# Patient Record
Sex: Male | Born: 1935 | Race: White | Hispanic: No | Marital: Single | Smoking: Former smoker
Health system: Southern US, Community
[De-identification: ages and names within clinical notes are randomized; demographics above are authoritative.]

## PROBLEM LIST (undated history)

## (undated) DIAGNOSIS — G609 Hereditary and idiopathic neuropathy, unspecified: Secondary | ICD-10-CM

## (undated) DIAGNOSIS — N318 Other neuromuscular dysfunction of bladder: Secondary | ICD-10-CM

## (undated) DIAGNOSIS — Z9089 Acquired absence of other organs: Secondary | ICD-10-CM

## (undated) DIAGNOSIS — Z87891 Personal history of nicotine dependence: Secondary | ICD-10-CM

## (undated) DIAGNOSIS — E291 Testicular hypofunction: Secondary | ICD-10-CM

## (undated) DIAGNOSIS — R7309 Other abnormal glucose: Secondary | ICD-10-CM

## (undated) DIAGNOSIS — Z87898 Personal history of other specified conditions: Secondary | ICD-10-CM

## (undated) DIAGNOSIS — I739 Peripheral vascular disease, unspecified: Secondary | ICD-10-CM

## (undated) DIAGNOSIS — K5010 Crohn's disease of large intestine without complications: Secondary | ICD-10-CM

## (undated) DIAGNOSIS — K5732 Diverticulitis of large intestine without perforation or abscess without bleeding: Secondary | ICD-10-CM

## (undated) DIAGNOSIS — F528 Other sexual dysfunction not due to a substance or known physiological condition: Secondary | ICD-10-CM

## (undated) DIAGNOSIS — E785 Hyperlipidemia, unspecified: Secondary | ICD-10-CM

## (undated) HISTORY — DX: Hereditary and idiopathic neuropathy, unspecified: G60.9

## (undated) HISTORY — DX: Other sexual dysfunction not due to a substance or known physiological condition: F52.8

## (undated) HISTORY — PX: OTHER SURGICAL HISTORY: SHX169

## (undated) HISTORY — DX: Hyperlipidemia, unspecified: E78.5

## (undated) HISTORY — DX: Other neuromuscular dysfunction of bladder: N31.8

## (undated) HISTORY — DX: Testicular hypofunction: E29.1

## (undated) HISTORY — DX: Crohn's disease of large intestine without complications: K50.10

## (undated) HISTORY — DX: Acquired absence of other organs: Z90.89

## (undated) HISTORY — PX: SHOULDER ARTHROSCOPY: SHX128

## (undated) HISTORY — PX: TONSILLECTOMY: SHX5217

## (undated) HISTORY — DX: Other abnormal glucose: R73.09

## (undated) HISTORY — DX: Personal history of other specified conditions: Z87.898

## (undated) HISTORY — DX: Diverticulitis of large intestine without perforation or abscess without bleeding: K57.32

## (undated) HISTORY — DX: Peripheral vascular disease, unspecified: I73.9

## (undated) HISTORY — PX: SEPTOPLASTY: SUR1290

## (undated) HISTORY — DX: Personal history of nicotine dependence: Z87.891

---

## 1985-05-05 ENCOUNTER — Encounter: Payer: Self-pay | Admitting: Gastroenterology

## 1985-08-18 ENCOUNTER — Encounter: Payer: Self-pay | Admitting: Gastroenterology

## 1997-10-17 ENCOUNTER — Encounter: Payer: Self-pay | Admitting: Internal Medicine

## 1997-10-17 ENCOUNTER — Ambulatory Visit (HOSPITAL_COMMUNITY): Admission: RE | Admit: 1997-10-17 | Discharge: 1997-10-17 | Payer: Self-pay | Admitting: Internal Medicine

## 1999-07-18 ENCOUNTER — Encounter: Payer: Self-pay | Admitting: Physical Medicine and Rehabilitation

## 1999-07-18 ENCOUNTER — Encounter
Admission: RE | Admit: 1999-07-18 | Discharge: 1999-07-18 | Payer: Self-pay | Admitting: Physical Medicine and Rehabilitation

## 1999-11-19 ENCOUNTER — Encounter: Admission: RE | Admit: 1999-11-19 | Discharge: 1999-11-19 | Payer: Self-pay | Admitting: Internal Medicine

## 1999-11-19 ENCOUNTER — Encounter: Payer: Self-pay | Admitting: Internal Medicine

## 2002-03-03 ENCOUNTER — Ambulatory Visit (HOSPITAL_COMMUNITY): Admission: RE | Admit: 2002-03-03 | Discharge: 2002-03-03 | Payer: Self-pay | Admitting: Neurology

## 2003-06-13 ENCOUNTER — Encounter: Payer: Self-pay | Admitting: Gastroenterology

## 2003-11-23 ENCOUNTER — Ambulatory Visit: Payer: Self-pay | Admitting: Internal Medicine

## 2004-02-17 ENCOUNTER — Ambulatory Visit: Payer: Self-pay | Admitting: Internal Medicine

## 2004-03-20 ENCOUNTER — Ambulatory Visit: Payer: Self-pay | Admitting: Internal Medicine

## 2004-04-05 ENCOUNTER — Ambulatory Visit: Payer: Self-pay | Admitting: Internal Medicine

## 2004-04-13 ENCOUNTER — Ambulatory Visit: Payer: Self-pay

## 2004-10-18 ENCOUNTER — Ambulatory Visit: Payer: Self-pay | Admitting: Internal Medicine

## 2005-07-02 ENCOUNTER — Ambulatory Visit: Payer: Self-pay | Admitting: Internal Medicine

## 2005-08-29 ENCOUNTER — Ambulatory Visit: Payer: Self-pay | Admitting: Internal Medicine

## 2005-09-03 ENCOUNTER — Ambulatory Visit: Payer: Self-pay | Admitting: Internal Medicine

## 2005-09-11 ENCOUNTER — Ambulatory Visit: Payer: Self-pay | Admitting: Internal Medicine

## 2005-09-25 ENCOUNTER — Ambulatory Visit: Payer: Self-pay

## 2005-11-08 ENCOUNTER — Ambulatory Visit: Payer: Self-pay | Admitting: Internal Medicine

## 2005-12-09 ENCOUNTER — Ambulatory Visit: Payer: Self-pay | Admitting: Internal Medicine

## 2005-12-09 LAB — CONVERTED CEMR LAB
Basophils Absolute: 0.1 10*3/uL (ref 0.0–0.1)
Basophils Relative: 0.8 % (ref 0.0–1.0)
Eosinophil percent: 1.2 % (ref 0.0–5.0)
HCT: 51.3 % (ref 39.0–52.0)
Hemoglobin: 16.5 g/dL (ref 13.0–17.0)
Hgb A1c MFr Bld: 6.2 % — ABNORMAL HIGH (ref 4.6–6.0)
Lymphocytes Relative: 19.2 % (ref 12.0–46.0)
MCHC: 32.2 g/dL (ref 30.0–36.0)
MCV: 91.3 fL (ref 78.0–100.0)
Monocytes Absolute: 0.7 10*3/uL (ref 0.2–0.7)
Monocytes Relative: 8.6 % (ref 3.0–11.0)
Neutro Abs: 6 10*3/uL (ref 1.4–7.7)
Neutrophils Relative %: 70.2 % (ref 43.0–77.0)
Platelets: 299 10*3/uL (ref 150–400)
RBC: 5.62 M/uL (ref 4.22–5.81)
RDW: 16.2 % — ABNORMAL HIGH (ref 11.5–14.6)
WBC: 8.5 10*3/uL (ref 4.5–10.5)

## 2006-08-20 ENCOUNTER — Ambulatory Visit: Payer: Self-pay | Admitting: Gastroenterology

## 2006-09-03 ENCOUNTER — Ambulatory Visit: Payer: Self-pay | Admitting: Gastroenterology

## 2006-09-03 LAB — HM COLONOSCOPY

## 2006-09-24 ENCOUNTER — Ambulatory Visit: Payer: Self-pay | Admitting: Internal Medicine

## 2006-09-24 ENCOUNTER — Encounter: Payer: Self-pay | Admitting: Internal Medicine

## 2006-09-24 LAB — CONVERTED CEMR LAB
ALT: 54 units/L — ABNORMAL HIGH (ref 0–53)
AST: 35 units/L (ref 0–37)
Albumin: 3.8 g/dL (ref 3.5–5.2)
Alkaline Phosphatase: 46 units/L (ref 39–117)
BUN: 10 mg/dL (ref 6–23)
Basophils Absolute: 0.1 10*3/uL (ref 0.0–0.1)
Basophils Relative: 1.5 % — ABNORMAL HIGH (ref 0.0–1.0)
Bilirubin Urine: NEGATIVE
Bilirubin, Direct: 0.2 mg/dL (ref 0.0–0.3)
CO2: 31 meq/L (ref 19–32)
Calcium: 8.9 mg/dL (ref 8.4–10.5)
Chloride: 106 meq/L (ref 96–112)
Cholesterol: 196 mg/dL (ref 0–200)
Creatinine, Ser: 0.9 mg/dL (ref 0.4–1.5)
Eosinophils Absolute: 0.1 10*3/uL (ref 0.0–0.6)
Eosinophils Relative: 1.5 % (ref 0.0–5.0)
GFR calc Af Amer: 107 mL/min
GFR calc non Af Amer: 88 mL/min
Glucose, Bld: 127 mg/dL — ABNORMAL HIGH (ref 70–99)
HCT: 51.6 % (ref 39.0–52.0)
HDL: 27.4 mg/dL — ABNORMAL LOW (ref >39.0)
Hemoglobin: 17.7 g/dL — ABNORMAL HIGH (ref 13.0–17.0)
Ketones, ur: NEGATIVE mg/dL
LDL Cholesterol: 130 mg/dL — ABNORMAL HIGH (ref 0–99)
Leukocytes, UA: NEGATIVE
Lymphocytes Relative: 19.9 % (ref 12.0–46.0)
MCHC: 34.2 g/dL (ref 30.0–36.0)
MCV: 96 fL (ref 78.0–100.0)
Monocytes Absolute: 0.6 10*3/uL (ref 0.2–0.7)
Monocytes Relative: 8.6 % (ref 3.0–11.0)
Neutro Abs: 5 10*3/uL (ref 1.4–7.7)
Neutrophils Relative %: 68.5 % (ref 43.0–77.0)
Nitrite: NEGATIVE
PSA: 3.59 ng/mL (ref 0.10–4.00)
Platelets: 243 10*3/uL (ref 150–400)
Potassium: 4.8 meq/L (ref 3.5–5.1)
RBC: 5.38 M/uL (ref 4.22–5.81)
RDW: 13.3 % (ref 11.5–14.6)
Sodium: 142 meq/L (ref 135–145)
Specific Gravity, Urine: 1.015 (ref 1.000–1.03)
TSH: 3.02 microintl units/mL (ref 0.35–5.50)
Total Bilirubin: 1.1 mg/dL (ref 0.3–1.2)
Total CHOL/HDL Ratio: 7.2
Total Protein, Urine: NEGATIVE mg/dL
Total Protein: 6.9 g/dL (ref 6.0–8.3)
Triglycerides: 193 mg/dL — ABNORMAL HIGH (ref 0–149)
Urine Glucose: NEGATIVE mg/dL
Urobilinogen, UA: 0.2 (ref 0.0–1.0)
VLDL: 39 mg/dL (ref 0–40)
WBC: 7.2 10*3/uL (ref 4.5–10.5)
pH: 6 (ref 5.0–8.0)

## 2006-10-03 ENCOUNTER — Encounter: Payer: Self-pay | Admitting: Internal Medicine

## 2006-10-03 ENCOUNTER — Ambulatory Visit: Payer: Self-pay | Admitting: Internal Medicine

## 2006-10-08 ENCOUNTER — Ambulatory Visit: Payer: Self-pay | Admitting: Internal Medicine

## 2006-10-08 LAB — CONVERTED CEMR LAB
Glucose, 2 hour: 144 mg/dL — ABNORMAL HIGH (ref 70–139)
Hgb A1c MFr Bld: 6.8 % — ABNORMAL HIGH (ref 4.6–6.0)

## 2006-10-20 ENCOUNTER — Ambulatory Visit: Payer: Self-pay | Admitting: Internal Medicine

## 2007-02-17 ENCOUNTER — Encounter: Payer: Self-pay | Admitting: *Deleted

## 2007-02-17 DIAGNOSIS — Z9089 Acquired absence of other organs: Secondary | ICD-10-CM | POA: Insufficient documentation

## 2007-02-17 DIAGNOSIS — E782 Mixed hyperlipidemia: Secondary | ICD-10-CM | POA: Insufficient documentation

## 2007-02-17 DIAGNOSIS — I739 Peripheral vascular disease, unspecified: Secondary | ICD-10-CM | POA: Insufficient documentation

## 2007-02-17 DIAGNOSIS — R739 Hyperglycemia, unspecified: Secondary | ICD-10-CM | POA: Insufficient documentation

## 2007-02-17 DIAGNOSIS — G609 Hereditary and idiopathic neuropathy, unspecified: Secondary | ICD-10-CM

## 2007-02-17 DIAGNOSIS — E785 Hyperlipidemia, unspecified: Secondary | ICD-10-CM

## 2007-02-17 DIAGNOSIS — M48061 Spinal stenosis, lumbar region without neurogenic claudication: Secondary | ICD-10-CM | POA: Insufficient documentation

## 2007-02-17 DIAGNOSIS — F528 Other sexual dysfunction not due to a substance or known physiological condition: Secondary | ICD-10-CM

## 2007-02-17 DIAGNOSIS — Z87898 Personal history of other specified conditions: Secondary | ICD-10-CM | POA: Insufficient documentation

## 2007-02-17 DIAGNOSIS — E291 Testicular hypofunction: Secondary | ICD-10-CM

## 2007-02-17 DIAGNOSIS — N318 Other neuromuscular dysfunction of bladder: Secondary | ICD-10-CM | POA: Insufficient documentation

## 2007-02-17 DIAGNOSIS — R7309 Other abnormal glucose: Secondary | ICD-10-CM

## 2007-02-17 HISTORY — DX: Other neuromuscular dysfunction of bladder: N31.8

## 2007-02-17 HISTORY — DX: Testicular hypofunction: E29.1

## 2007-02-17 HISTORY — DX: Acquired absence of other organs: Z90.89

## 2007-02-17 HISTORY — DX: Hereditary and idiopathic neuropathy, unspecified: G60.9

## 2007-02-17 HISTORY — DX: Hyperlipidemia, unspecified: E78.5

## 2007-02-17 HISTORY — DX: Personal history of other specified conditions: Z87.898

## 2007-02-17 HISTORY — DX: Other abnormal glucose: R73.09

## 2007-02-17 HISTORY — DX: Peripheral vascular disease, unspecified: I73.9

## 2007-02-17 HISTORY — DX: Other sexual dysfunction not due to a substance or known physiological condition: F52.8

## 2007-10-07 ENCOUNTER — Ambulatory Visit: Payer: Self-pay | Admitting: Internal Medicine

## 2007-10-22 ENCOUNTER — Encounter: Payer: Self-pay | Admitting: Internal Medicine

## 2007-10-22 ENCOUNTER — Ambulatory Visit: Payer: Self-pay | Admitting: Internal Medicine

## 2007-11-04 ENCOUNTER — Ambulatory Visit: Payer: Self-pay | Admitting: Internal Medicine

## 2007-11-04 LAB — CONVERTED CEMR LAB
AST: 26 units/L (ref 0–37)
Albumin: 3.6 g/dL (ref 3.5–5.2)
BUN: 11 mg/dL (ref 6–23)
Cholesterol: 160 mg/dL (ref 0–200)
Creatinine, Ser: 0.9 mg/dL (ref 0.4–1.5)
GFR calc Af Amer: 107 mL/min
GFR calc non Af Amer: 88 mL/min
HDL: 26.2 mg/dL — ABNORMAL LOW (ref >39.0)
Hgb A1c MFr Bld: 6.3 % — ABNORMAL HIGH (ref 4.6–6.0)
VLDL: 44 mg/dL — ABNORMAL HIGH (ref 0–40)

## 2007-12-04 ENCOUNTER — Telehealth: Payer: Self-pay | Admitting: Internal Medicine

## 2008-01-08 HISTORY — PX: OTHER SURGICAL HISTORY: SHX169

## 2008-01-12 ENCOUNTER — Telehealth: Payer: Self-pay | Admitting: Gastroenterology

## 2008-01-13 ENCOUNTER — Ambulatory Visit: Payer: Self-pay | Admitting: Gastroenterology

## 2008-01-13 ENCOUNTER — Ambulatory Visit: Payer: Self-pay | Admitting: Cardiology

## 2008-01-13 LAB — CONVERTED CEMR LAB
Basophils Absolute: 0 10*3/uL (ref 0.0–0.1)
Basophils Relative: 0.2 % (ref 0.0–3.0)
CO2: 31 meq/L (ref 19–32)
Chloride: 104 meq/L (ref 96–112)
Creatinine, Ser: 1 mg/dL (ref 0.4–1.5)
Lymphocytes Relative: 18.4 % (ref 12.0–46.0)
MCHC: 34.5 g/dL (ref 30.0–36.0)
Neutrophils Relative %: 67.9 % (ref 43.0–77.0)
Potassium: 4.1 meq/L (ref 3.5–5.1)
RBC: 4.64 M/uL (ref 4.22–5.81)
WBC: 9.4 10*3/uL (ref 4.5–10.5)

## 2008-01-14 ENCOUNTER — Telehealth: Payer: Self-pay | Admitting: Nurse Practitioner

## 2008-01-20 LAB — HM DIABETES FOOT EXAM

## 2008-02-04 ENCOUNTER — Ambulatory Visit: Payer: Self-pay | Admitting: Gastroenterology

## 2008-02-04 DIAGNOSIS — K5732 Diverticulitis of large intestine without perforation or abscess without bleeding: Secondary | ICD-10-CM | POA: Insufficient documentation

## 2008-02-04 HISTORY — DX: Diverticulitis of large intestine without perforation or abscess without bleeding: K57.32

## 2008-03-21 ENCOUNTER — Telehealth: Payer: Self-pay | Admitting: Gastroenterology

## 2008-03-22 ENCOUNTER — Ambulatory Visit: Payer: Self-pay | Admitting: Gastroenterology

## 2008-03-22 LAB — CONVERTED CEMR LAB
Basophils Relative: 0 % (ref 0.0–3.0)
Eosinophils Relative: 1.3 % (ref 0.0–5.0)
HCT: 45 % (ref 39.0–52.0)
Hemoglobin: 15.6 g/dL (ref 13.0–17.0)
Lymphs Abs: 1.3 10*3/uL (ref 0.7–4.0)
MCV: 95 fL (ref 78.0–100.0)
Monocytes Absolute: 0.7 10*3/uL (ref 0.1–1.0)
Neutro Abs: 6 10*3/uL (ref 1.4–7.7)
Platelets: 272 10*3/uL (ref 150.0–400.0)
RBC: 4.74 M/uL (ref 4.22–5.81)
WBC: 8.1 10*3/uL (ref 4.5–10.5)

## 2008-03-23 ENCOUNTER — Ambulatory Visit: Payer: Self-pay | Admitting: Gastroenterology

## 2008-03-23 ENCOUNTER — Encounter: Payer: Self-pay | Admitting: Gastroenterology

## 2008-03-24 ENCOUNTER — Telehealth: Payer: Self-pay | Admitting: Gastroenterology

## 2008-03-28 ENCOUNTER — Encounter: Payer: Self-pay | Admitting: Gastroenterology

## 2008-03-29 ENCOUNTER — Telehealth: Payer: Self-pay | Admitting: Gastroenterology

## 2008-04-12 ENCOUNTER — Encounter: Payer: Self-pay | Admitting: Internal Medicine

## 2008-04-22 ENCOUNTER — Ambulatory Visit: Payer: Self-pay | Admitting: Gastroenterology

## 2008-05-02 ENCOUNTER — Telehealth: Payer: Self-pay | Admitting: Gastroenterology

## 2008-05-12 ENCOUNTER — Telehealth: Payer: Self-pay | Admitting: Gastroenterology

## 2008-06-21 ENCOUNTER — Encounter: Payer: Self-pay | Admitting: Internal Medicine

## 2008-06-23 ENCOUNTER — Ambulatory Visit: Payer: Self-pay | Admitting: Gastroenterology

## 2008-06-23 DIAGNOSIS — K5010 Crohn's disease of large intestine without complications: Secondary | ICD-10-CM

## 2008-06-23 HISTORY — DX: Crohn's disease of large intestine without complications: K50.10

## 2008-10-14 ENCOUNTER — Telehealth: Payer: Self-pay | Admitting: Internal Medicine

## 2008-10-24 ENCOUNTER — Encounter: Payer: Self-pay | Admitting: Internal Medicine

## 2008-10-27 ENCOUNTER — Encounter: Payer: Self-pay | Admitting: Internal Medicine

## 2008-11-02 ENCOUNTER — Encounter (INDEPENDENT_AMBULATORY_CARE_PROVIDER_SITE_OTHER): Payer: Self-pay | Admitting: *Deleted

## 2008-11-22 ENCOUNTER — Ambulatory Visit: Payer: Self-pay | Admitting: Internal Medicine

## 2008-11-22 LAB — CONVERTED CEMR LAB
Albumin: 4 g/dL (ref 3.5–5.2)
Basophils Relative: 0.6 % (ref 0.0–3.0)
CO2: 29 meq/L (ref 19–32)
Chloride: 104 meq/L (ref 96–112)
Cholesterol: 168 mg/dL (ref 0–200)
Eosinophils Relative: 1.9 % (ref 0.0–5.0)
HCT: 41.8 % (ref 39.0–52.0)
HDL: 32.1 mg/dL — ABNORMAL LOW (ref >39.00)
Hgb A1c MFr Bld: 5.9 % (ref 4.6–6.5)
LDL Cholesterol: 107 mg/dL — ABNORMAL HIGH (ref 0–99)
MCV: 96.3 fL (ref 78.0–100.0)
Monocytes Absolute: 1 10*3/uL (ref 0.1–1.0)
Monocytes Relative: 14.8 % — ABNORMAL HIGH (ref 3.0–12.0)
Neutrophils Relative %: 63.7 % (ref 43.0–77.0)
RBC: 4.34 M/uL (ref 4.22–5.81)
Sodium: 142 meq/L (ref 135–145)
Total CHOL/HDL Ratio: 5
Total Protein: 6.9 g/dL (ref 6.0–8.3)
Triglycerides: 144 mg/dL (ref 0.0–149.0)
VLDL: 28.8 mg/dL (ref 0.0–40.0)
WBC: 6.8 10*3/uL (ref 4.5–10.5)

## 2008-12-20 ENCOUNTER — Ambulatory Visit: Payer: Self-pay | Admitting: Gastroenterology

## 2008-12-21 ENCOUNTER — Telehealth: Payer: Self-pay | Admitting: Internal Medicine

## 2008-12-26 ENCOUNTER — Telehealth: Payer: Self-pay | Admitting: Internal Medicine

## 2009-01-23 ENCOUNTER — Telehealth (INDEPENDENT_AMBULATORY_CARE_PROVIDER_SITE_OTHER): Payer: Self-pay | Admitting: *Deleted

## 2009-04-04 ENCOUNTER — Encounter: Payer: Self-pay | Admitting: Internal Medicine

## 2009-04-07 ENCOUNTER — Telehealth: Payer: Self-pay | Admitting: Internal Medicine

## 2009-04-25 ENCOUNTER — Encounter: Payer: Self-pay | Admitting: Internal Medicine

## 2009-08-10 ENCOUNTER — Encounter: Payer: Self-pay | Admitting: Internal Medicine

## 2009-08-17 ENCOUNTER — Encounter: Payer: Self-pay | Admitting: Internal Medicine

## 2009-10-26 ENCOUNTER — Encounter: Payer: Self-pay | Admitting: Internal Medicine

## 2009-12-26 ENCOUNTER — Encounter: Payer: Self-pay | Admitting: Internal Medicine

## 2009-12-26 ENCOUNTER — Ambulatory Visit: Payer: Self-pay | Admitting: Internal Medicine

## 2009-12-26 DIAGNOSIS — Z87891 Personal history of nicotine dependence: Secondary | ICD-10-CM

## 2009-12-26 HISTORY — DX: Personal history of nicotine dependence: Z87.891

## 2009-12-26 LAB — CONVERTED CEMR LAB
Albumin: 3.8 g/dL (ref 3.5–5.2)
Alkaline Phosphatase: 46 units/L (ref 39–117)
Cholesterol: 167 mg/dL (ref 0–200)
Hgb A1c MFr Bld: 6 % (ref 4.6–6.5)
LDL Cholesterol: 97 mg/dL (ref 0–99)
Total CHOL/HDL Ratio: 5
Total Protein: 6.8 g/dL (ref 6.0–8.3)
Triglycerides: 194 mg/dL — ABNORMAL HIGH (ref 0.0–149.0)
VLDL: 38.8 mg/dL (ref 0.0–40.0)

## 2010-02-01 ENCOUNTER — Telehealth: Payer: Self-pay | Admitting: Internal Medicine

## 2010-02-08 NOTE — Letter (Signed)
Summary: [facility]   Imported By: Bubba Hales 08/22/2009 08:28:47  _____________________________________________________________________  External Attachment:    Type:   Image     Comment:   External Document

## 2010-02-08 NOTE — Letter (Signed)
Summary: [facility]   Imported By: Phillis Knack 05/17/2009 08:57:38  _____________________________________________________________________  External Attachment:    Type:   Image     Comment:   External Document

## 2010-02-08 NOTE — Letter (Signed)
Summary: [facility]  [facility]   Imported By: Lennie Odor 11/21/2009 14:18:01  _____________________________________________________________________  External Attachment:    Type:   Image     Comment:   External Document

## 2010-02-08 NOTE — Progress Notes (Signed)
Summary: Puncture wound   Phone Note Call from Patient Call back at [facility] Phone [phone]   Summary of Call: Pt steped on a nail yesterday. Last tetanus was 08/2005 per pt's record. He wants a call back regarding this.  Initial call taken by: Charlsie Quest, [place],  February 01, 2010 12:44 PM  Follow-up for Phone Call        Spoke w/pt. He stepped on a "clean" nail yesterday. Slight puncture wound. Pt washed area and is keeping it clean & dry. Advised he look at area daily for signs of infection or that it is not healing and call w/any concerns. Pt agreed.  Last tetanus was at the [facility] 08/2006. EMR UPDATED Follow-up by: Charlsie Quest, [place],  February 01, 2010 12:49 PM  Additional Follow-up for Phone Call Additional follow up Details #1::        no further treatment needed. Agree with advice Additional Follow-up by: Neena Rhymes MD,  February 01, 2010 5:18 PM      Immunization History:  Tetanus/Td Immunization History:    Tetanus/Td:  historical (08/08/2006)

## 2010-02-08 NOTE — Letter (Signed)
Summary: [facility]   Imported By: Phillis Knack 09/07/2009 13:24:13  _____________________________________________________________________  External Attachment:    Type:   Image     Comment:   External Document

## 2010-02-08 NOTE — Letter (Signed)
Summary: Return Visit/[facility]  Return Visit/[facility]   Imported By: Sherian Rein 09/12/2009 15:11:43  _____________________________________________________________________  External Attachment:    Type:   Image     Comment:   External Document

## 2010-02-08 NOTE — Progress Notes (Signed)
      Vision Screening:      Vision Comments: Last eye exam 04/04/2009 with Dr Luberta Mutter no diabetic retinopathy was detected. return advised in one year.  Vision Entered By: [name] CMA (April 07, 2009 12:01 PM)

## 2010-02-08 NOTE — Progress Notes (Signed)
Summary: [facility]  Phone Note Call from Patient   Initial call taken by: Lamar Sprinkles, CMA,  January 23, 2009 4:08 PM Summary of Call: Patient is requesting pravastatin rx as alternative to simvastatin due to cost. Is this possible?  Initial call taken by: Lamar Sprinkles, CMA,  January 23, 2009 4:10 PM  Follow-up for Phone Call        Not a good switch - pravastatin is much less potent than simvastatin.  Follow-up by: Jacques Navy MD,  January 23, 2009 5:23 PM  Additional Follow-up for Phone Call Additional follow up Details #1::        Is there another generic that you recommend? Additional Follow-up by: Lucious Groves,  January 25, 2009 11:08 AM    Additional Follow-up for Phone Call Additional follow up Details #2::    simvastatin is generic. Will change to lovastatin 20mg  once daily, $4 for 30, 10$ for 90 at [facility]. Rx eScribed to [facility] Follow-up by: Jacques Navy MD,  January 25, 2009 1:21 PM  Additional Follow-up for Phone Call Additional follow up Details #3:: Details for Additional Follow-up Action Taken: Informed pt. Additional Follow-up by: Josph Macho CMA,  January 26, 2009 9:00 AM  New/Updated Medications: LOVASTATIN 20 MG TABS (LOVASTATIN) 1 by mouth q PM Prescriptions: LOVASTATIN 20 MG TABS (LOVASTATIN) 1 by mouth q PM  #90 x 3   Entered and Authorized by:   Jacques Navy MD   Signed by:   Jacques Navy MD on 01/25/2009   Method used:   Electronically to        [facility]  [phone]* (retail)       [address]       [place], [place]  [address]       Ph: [phone] or [phone]       Fax: [phone]   RxID:   [id number]

## 2010-02-08 NOTE — Assessment & Plan Note (Signed)
Summary: YEARLY FU/ LABS AFTER/ MEDICARE/NWS  #   Vital Signs:  English profile:   77 year old male Height:      69 inches Weight:      208 pounds BMI:     30.83 O2 Sat:      95 % on Room air Temp:     98.7 degrees F oral Pulse rate:   75 / minute BP sitting:   112 / 62  (left arm) Cuff size:   regular  Vitals Entered By: [name] CMA (December 26, 2009 2:57 PM)  O2 Flow:  Room air  Primary Care Provider:  [name], MD   History of Present Illness: Routine wellness exam and medical follow-up/  Continues to have peripheral neuropathy. He has tried off label meds from the internet. Wants to try Anodyn therapy.  Has seen Dr. [name]: had elevated PSA - came to prostat biopsy - negative.   Interval hisotry otherwise unremarkable.  He is 100% independent in his ADLs. He has not had any falls and has no increased fall risk. He has no symptoms or signs of depression. He is cognatively intact: handles his bank account, properties, social interactions.   Preventive Screening-Counseling & Management  Alcohol-Tobacco     Alcohol drinks/day: 2     Alcohol type: beer     Smoking Status: quit     Year Quit: 1990  Caffeine-Diet-Exercise     Caffeine use/day: none     Diet Comments: regular diet - not a heart healthy diet     Diet Counseling: to improve diet; diet is suboptimal     Does English Exercise: no     Exercise Counseling: to improve exercise regimen  Hep-HIV-STD-Contraception     Hepatitis Risk: no risk noted     HIV Risk: no risk noted     STD Risk: no risk noted     Dental Visit-last 6 months no     TSE monthly: no     Sun Exposure-Excessive: no  Safety-Violence-Falls     Seat Belt Use: yes     Helmet Use: n/a     Firearms in the Home: no firearms in the home     Smoke Detectors: yes     Violence in the Home: no risk noted     Sexual Abuse: no     Fall Risk: low fall risk      Sexual History:  currently monogamous.        Drug Use:  never.      Blood Transfusions:  no.    Current Medications (verified): 1)  Lovastatin 20 Mg Tabs (Lovastatin) .Marland Kitchen.. 1 By Mouth Q Pm 2)  Tylenol Extra Strength 500 Mg  Tabs (Acetaminophen) .... As Needed 3)  Epi-Pen .... As Needed 4)  Neurontin 300 Mg Caps (Gabapentin) .Marland Kitchen.. 1 Capsule By Mouth Three Times A Day 5)  Metformin Hcl 500 Mg Tabs (Metformin Hcl) .Marland Kitchen.. 1 Tablet By Mouth Two Times A Day 6)  Asacol 400 Mg Tbec (Mesalamine) .... Take Two Tabs By Mouth Two Times A Day 7)  Tamsulosin Hcl 0.4 Mg Caps (Tamsulosin Hcl) .Marland Kitchen.. 1 By Mouth Once Daily 8)  Detrol 2 Mg Tabs (Tolterodine Tartrate) .... One Tablet By Mouth Two Times A Day  Allergies (verified): 1)  ! 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 English is a former  smoker.  Alcohol Use - yes Illicit Drug Use - no  Social History: Caffeine use/day:  none Dental Care w/in 6 mos.:  no Sun Exposure-Excessive:  no Seat Belt Use:  yes Fall Risk:  low fall risk Blood Transfusions:  no Hepatitis Risk:  no risk noted HIV Risk:  no risk noted STD Risk:  no risk noted Sexual History:  currently monogamous Drug Use:  never  Review of Systems  The English denies anorexia, fever, weight loss, weight gain, vision loss, decreased hearing, hoarseness, chest pain, syncope, dyspnea on exertion, peripheral edema, prolonged cough, abdominal pain, severe indigestion/heartburn, incontinence, muscle weakness, difficulty walking, depression, abnormal bleeding, and enlarged lymph nodes.    Physical Exam  General:  overweight white male no disress Head:  Normocephalic and atraumatic without obvious abnormalities. No apparent alopecia or balding. Eyes:  vision grossly intact, pupils equal, pupils round, and corneas and lenses clear.  fundiscopic exam deferred to opthal. Lids look ok Ears:  Old perforations lower quadrant TM bilaterally. Let TM appears wet Nose:  no external deformity and no external erythema.   Mouth:  good dentition, no gingival abnormalities, and no dental plaque.  throat clear Neck:  no thyromegaly and no carotid bruits.   Chest Wall:  no deformities.   Lungs:  Normal respiratory effort, chest expands symmetrically. Lungs are clear to auscultation, no crackles or wheezes. Heart:  Normal rate and regular rhythm. S1 and S2 normal without gallop, murmur, click, rub or other extra sounds. Abdomen:  obese, BS+ x 4, umbilical hernia [id number], nontender. No HSM Prostate:  deferred to GU [name]) Msk:  normal ROM, no joint tenderness, no joint swelling, no joint warmth, and no joint deformities.   Pulses:  2+ rdial, 1+ DP Extremities:  No clubbing, cyanosis, edema, or deformity noted with normal full range of motion of all joints.   Neurologic:  alert &  oriented X3, cranial nerves II-XII intact, and strength normal in all extremities.  Decreased deep vibratory sensation foot. Normal gait. Normal DTRs. Skin:  turgor normal, color normal, no rashes, no purpura, and no ulcerations.   Cervical Nodes:  no anterior cervical adenopathy and no posterior cervical adenopathy.   Inguinal Nodes:  no R inguinal adenopathy and no L inguinal adenopathy.   Psych:  Oriented X3, memory intact for recent and remote, normally interactive, and good eye contact.     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 He continues on asacol. He last saw Dr. Sharlett Iles December '10. He has been stable. He does not wish to start seeing a [facility] specialist and will stay with Dr. Sharlett Iles.  Problem # 2:  BENIGN PROSTATIC HYPERTROPHY, HX OF (ICD-V13.8) English been  fully evaluated by Dr. Alona Bene. No additional evaluation indicated at this time.   Problem # 3:  PEPTIC ULCER DISEASE, HX OF WITH ANTRAL ULCER (ICD-V12.71) Stable with no recurrent problems  Problem # 4:  PERIPHERAL NEUROPATHY (ICD-356.9) Major life-style issue. He has tried many treatments including direct to consumer products. He does continue on gabapentin although he has not been on large doses.  Plan - ok for anodyn therapy           increase gabapentin step-wise to 650m three times a day.           No recommendation in regard to internet based treatments.   Problem # 5:  HYPERGLYCEMIA (ICD-790.29) For A1C with recommendations to follow.   His updated medication list for this problem includes:    Metformin Hcl 500 Mg Tabs (Metformin hcl) ..Marland Kitchen.. 1 tablet by mouth two times a day  Orders: TLB-A1C / Hgb A1C (Glycohemoglobin) (83036-A1C)  Addendum- A1C 6% - normal range  Problem # 6:  PERIPHERAL VASCULAR DISEASE (ICD-443.9) Stable. No limitation in ambulation; no leg or foot ulcers; no chronic pain.  Problem # 7:  HYPERLIPIDEMIA  (IBZM-0804) Routine lab with recommendations to follow.   His updated medication list for this problem includes:    Lovastatin 20 Mg Tabs (Lovastatin) ..Marland Kitchen.. 1 by mouth q pm  Orders: TLB-Lipid Panel (80061-LIPID) TLB-Hepatic/Liver Function Pnl (80076-HEPATIC)  Addendum - LDL 97 at goal   Problem # 8:  Preventive Health Care (ICD-V70.0) Interval history as noted but fortunately he is in good health. Exam remarkable for obesity and umbilical hernia otherwise normal. Limite lab is normal. [place] [facility] [place] [facility] Dr[facility] E[facility] Hailey [facility] with colorectal cancer screening with last study Aug '08. Immunizations: Tetnus March '05; Pneumonia Sept '09; Flu - Oct '10. He is a candidate for shingles vaccine [place] Spirometry does reveal decreased capacity with lung age greater than chronologic age. He may be a candidate for alpha-1-antitrypsin screening and for full PFTs. 12 Lead EKG without sign of ischemia.   IN summary - a very nice man who seems medically stable except for persistent and painful peripheral neuropathy. He is adivsed to follow a low fat low carb diet; he is advised to develop a reqular exercise regimen - water based exercise may be best for him; he is advised to loose weight via smart food choices (low fat options), control of portion size ( one plate and no seconds) and regular exercise. Target weight 180 lbs with a goal of loosing 1 lb per month ( two year project)  He will return as needed or 1 year.  Complete Medication List: 1)  Lovastatin 20 Mg Tabs (Lovastatin) ..Marland Kitchen. 1 by mouth q pm 2)  Tylenol Extra Strength 500 Mg Tabs (Acetaminophen) .... As needed 3)  Epi-pen  .... As needed 4)  Neurontin 300 Mg Caps (Gabapentin) ..Marland Kitchen. 1 capsule by mouth three times a day 5)  Metformin Hcl 500 Mg Tabs (Metformin hcl) ..Marland Kitchen. 1 tablet by mouth two times a day 6)  Asacol 400 Mg Tbec (Mesalamine) .... Take two  tabs by mouth two times a day 7)  Tamsulosin Hcl 0.4 Mg Caps (Tamsulosin hcl) ..Marland Kitchen. 1 by mouth once daily 8)  Detrol 2 Mg Tabs (Tolterodine tartrate) .... One tablet by mouth two times a day  Other Orders: Spirometry w/Graph (94010) Medicare -1st Annual Wellness Visit [phone] EKG w/ Interpretation (93000)  English: Edward English Note: All result statuses are Final unless otherwise noted.  Tests: (1) Lipid Panel (LIPID)   Cholesterol               167 mg/dL                   0-200     ATP III Classification            Desirable:  < 200 mg/dL                    Borderline High:  200 - 239 mg/dL               High:  > = 240 mg/dL   Triglycerides        [H]  194.0 mg/dL                 0.0-149.0     Normal:  <150 mg/dL     Borderline High:  150 - 199 mg/dL   HDL                  [L]  30.90 mg/dL                 >39.00   VLDL Cholesterol          38.8 mg/dL                  0.0-40.0   LDL Cholesterol           97 mg/dL                    0-99  CHO/HDL Ratio:  CHD Risk                             5                    Men          Women     1/2 Average Risk     3.4          3.3     Average Risk          5.0          4.4     2X Average Risk          9.6          7.1     3X Average Risk          15.0          11.0                           Tests: (2) Hepatic/Liver Function Panel (HEPATIC)   Total Bilirubin           0.9 mg/dL                   0.3-1.2   Direct Bilirubin          0.1 mg/dL                   0.0-0.3   Alkaline Phosphatase      46 U/L                      39-117   AST  18 U/L                      0-37   ALT                       21 U/L                      0-53   Total Protein             6.8 g/dL                    6.0-8.3   Albumin                   3.8 g/dL                    3.5-5.2  Tests: (3) Hemoglobin A1C (A1C)   Hemoglobin A1C            6.0 %                       4.6-6.5     Glycemic Control Guidelines for People with Diabetes:      Non Diabetic:  <6%     Goal of Therapy: <7%     Additional Action Suggested:  >8%  Orders Added: 1)  TLB-Lipid Panel [80061-LIPID] 2)  TLB-Hepatic/Liver Function Pnl [80076-HEPATIC] 3)  TLB-A1C / Hgb A1C (Glycohemoglobin) [83036-A1C] 4)  Spirometry w/Graph [94010] 5)  Medicare -1st Annual Wellness Visit [U9437] 6)  EKG w/ Interpretation [93000] 7)  Est. English Level IV [00525]

## 2010-02-08 NOTE — Letter (Signed)
Summary: [facility]  [facility]   Imported By: Phillis Knack 04/11/2009 11:16:42  _____________________________________________________________________  External Attachment:    Type:   Image     Comment:   External Document

## 2010-02-08 NOTE — Letter (Signed)
Summary: [facility]   Imported By: Phillis Knack 05/04/2009 09:02:50  _____________________________________________________________________  External Attachment:    Type:   Image     Comment:   External Document

## 2010-05-22 NOTE — Assessment & Plan Note (Signed)
Edward English                           PRIMARY CARE OFFICE NOTE   MACAULEY, MOSSBERG                    MRN:          [id number]  DATE:10/03/2006                            DOB:          [birth year]    Edward English is a 77 year old gentleman who presents today for annual  examination and review for his hyperlipidemia and peripheral vascular  disease. Patient was last seen in the office September 24, 2006 after  having a fall and having significant pain in his right chest wall. He  did have x-rays with rib details which revealed an absence of fracture  or abnormality. He continues to have some discomfort.   Patient has had a through evaluation by Dr. Nelida Gores. His PSA  has been well controlled. The patient has had cystoscopy and treated for  his prostatosis and over active bladder, neurogenic bladder. Last office  visit with Dr. Reece Agar is September 10, 2006.   Patient's past medical history, family history, and social history are  well documented in April 05, 2004 without any significant change.   CURRENT MEDICATIONS:  1. Zocor 10 mg daily.  2. Oxybutynin 5 mg daily.  3. Amitriptyline 10 mg daily.  4. Detrol daily.  5. Tylenol p.r.n.  6. Fexofenadine as needed.  7. Fluticasone 1 spray to each nostril as needed.  Patient does carry an Epipen for hymanoptra allergy.   REVIEW OF SYSTEMS:  Patient has had no fever, sweats, chills. He has had  no ENT, cardiovascular, respiratory, GI, or GU complaints.   PHYSICAL EXAMINATION:  Temperature was 97, blood pressure 128/72, pulse  93, weight 202.  GENERAL APPEARANCE: This is an overweight Caucasian male, looks his  stated age, no acute distress.  HEENT EXAM: Normocephalic, atraumatic. EACs and TM s were normal.  Oropharynx with native dentition in good repair. No buccal or palate  lesions were noted. Posterior pharynx was clear. Conjunctivae and sclera  appeared normal. Patient has  hyperemia of the left lower lid at the  lateral aspect.  NECK: Supple without thyromegaly.  NODES: No adenopathy was noted in the cervical, supraclavicular regions.  CHEST: Increased AP diameter, otherwise unremarkable.  LUNGS: Clear with no rales, wheezes, or rhonchi.  CARDIOVASCULAR: 2+ radial pulses. No JVD or carotid bruits. He had a  quiet precordium with a regular rate and rhythm without murmurs, rubs,  or gallops.  ABDOMEN: Soft. No guarding or rebound. No organosplenomegaly was  appreciated. Patient has an umbilical hernia which is soft and easily  reducible and nontender.  GENITAL AND RECTAL EXAM AND PROSTATE EXAM: Deferred to Dr. Reece Agar.  EXTREMITIES: Without clubbing, cyanosis, or edema. No deformities are  noted.  NEUROLOGIC EXAM: Grossly non focal.   DATA BASE:  Hemoglobin 17.7 grams, white count was 7,2000 with a normal  differential. Chemistries revealed a serum glucose of 127. Renal  function normal with a creatinine of 0.9. Liver functions were normal.  Cholesterol 196, triglycerides 193, HDL 27.4, LDL 130. TSH was normal at  3.02, PSA was normal at 3.59. Urinalysis was negative.   ASSESSMENT/PLAN:  1. Hyperlipidemia, patient is at goal at  an LDL of 130, however, he      does have elevated serum glucose and if it turns out to be a true      glucose metabolism problem we would need to lower the goal to an      LDL of 100 with an corresponding increase in Zocor.  2. Hyperglycemia, patient with an elevated serum glucose on fasting      labs. Plan, patient to return for 2 hour glucose tolerance test and      hemoglobin A1C. Recommendations will be based on lab results.  3. Genitourinary, patient is current and up to date with Dr. Reece Agar      and stable at this time.  4. Health maintenance, patient had a colonoscopy September 03, 2006 which      was normal except for diverticulosis. Patient did have carotid      Doppler performed September 25, 2005 with no  significant ICA      stenosis. Patient had a normal EKG in August of 2007. Patient did      have a bone density study in September of 2007 which was      unremarkable and should be repeated in 2009. Patient has had a      chest x-ray which was unremarkable.   SUMMARY:  This is a pleasant gentleman who will return for a glucose  tolerance test as noted, will be notified by phone of his lab result.     Edward Knuckles Norins, MD  Electronically Signed    MEN/MedQ  DD: 10/03/2006  DT: 10/03/2006  Job #: [id number]   cc:   Latina Craver, M.D.

## 2010-05-25 NOTE — Assessment & Plan Note (Signed)
[facility]                             PRIMARY CARE OFFICE NOTE   Edward English, Edward English                    MRN:          [id number]  DATE:09/03/2005                            DOB:          [birth year]    PRIMARY CARE OFFICE NOTE:  Mr. Coll is a 77 year old Caucasian gentleman  who is followed for hyperlipidemia and peripheral vascular disease who  presents today for followup evaluation and exam.  He was last seen in the  office July 02, 2005 after a __________ with allergy treated with  prednisone, fexofenadine and Ranitidine.   Patient's last full physical was April 05, 2004.  Please see that complete  dictation which includes complete past medical history, family history and  social history.   INTERVAL HISTORY:  Patient reports that he had been feeling well and doing  well.  He has been very concerned about his health including family history  of lower extremity popliteal aneurysm.  He did undergo life screening which  showed a normal ADI.  Bilaterally he did have a question of moderate carotid  disease, he had a heel scanned and had a T-score of minus 1.1 raising  concerns to his mind about all of these issues.  He actually reports that he  is feeling well, doing relatively well.  He reports that his peripheral  neuropathy continues to be a problem for him.  He was not able to tolerate  Amitriptyline because of dryness and drowsiness.   The patient did self-refer to Dr. Nelida Gores for evaluation in  regards to decreased urinary stream.  He has been seen by Dr. Reece Agar, has  had a DRE July 22, 2005 that was non-nodular and normal.  He had a PSA at  Dr. Reece Agar office that was 1.87 and he was thought to have BPH.  It was  thought that he had a problem with urinary frequency secondary to irritable  bladder.  After failing a trial of Flomax he was given a trial of Vesicare  and then this was changed to Oxybutynin by the [place].  Unfortunately he does  have a lot of dryness associated with the use of this medication but it has  controlled his urinary frequency.   REVIEW OF SYSTEMS:  Negative for any constitutional, ENT, cardiovascular,  respiratory, GI, GU complaints otherwise.   EXAMINATION:  VITAL SIGNS:  Temperature was 98.1.  Blood pressure 111/59.  Heart rate was 77.  Respirations were regular at 16.  Weight is 213 pounds.  GENERAL:  General appearance this is a mildly overweight Caucasian male who  looks younger than his stated chronological age in no acute distress.  HEENT:  Normocephalic and atraumatic.  Oropharynx was negative.  Dentition  in good repair.  No buckle or pallor lesions were noted.  Posterior pharynx  was clear.  Conjunctiva, sclerae was clear.  PERRLA.  Funduscopic exam with  hand held instrument revealed normal disc margins and no vascular  abnormalities.  NECK:  Supple without thyromegaly nodes.  No adenopathy was noted in the  cervical supraclavicular or axillary inguinal regions.  CHEST:  No  CVA tenderness.  Lungs were clear to auscultation and percussion.  CARDIOVASCULAR:  2+ posterior tibial pulse.  Bases of precordium was quiet.  He had a regular rate and rhythm without murmurs, rubs or gallops.  He had  no JVD, no carotid bruits on exam.  ABDOMEN:  Soft with no guarding or rebound.  He does have an umbilical  hernia which is non-tender.  He had no organo-splenomegaly.  GENITALIA, RECTAL:  Exam deferred to Dr. Reece Agar.  EXTREMITIES:  Without clubbing, cyanosis, edema.  No deformities were noted.   DATA BASE:  A 12-lead electrocardiogram revealed a normal sinus rhythm and a  normal EKG.   LABORATORY DATA:  Laboratory revealed a hemoglobin of 15 grams.  White count  was 7,500 with a normal differential.  Platelet count 268,000.  Cholesterol  148.  Triglycerides 161.  HDL low at 23.7.  LDL was 92.  Chemistries reveal  the serum glucose of 110.  Electrolytes were normal.  Kidney  function  normal.  Creatinine 1.0.  Liver functions were normal. Thyroid function  normal with a TSH of 2.56.  PSA is 3.60 markedly elevated from a month ago  when he saw Dr. Reece Agar.  Urinalysis was negative.   ASSESSMENT/PLAN:  1. Lipid.  The patient has excellent control of his low density      lipoprotein cholesterol, however his high density lipoprotein was low.      His HDL and LDL ratio is reasonable.  Patient advised to increase his      aerobic exercise and reduce his carbohydrates in order to raise his      high density lipoprotein.  No additional medication indicated at this      time.  2. Peripheral neuropathy.  Patient is given a sample of Cymbalta 30 mg to      take daily and if tolerated will advance to 60 mg daily for the      referral of his peripheral neuropathy with the goal of increasing to      twice daily if needed.  3. Genitourinary.  Patient is doing well with Oxybutynin.  He does      followup with Dr. Reece Agar.  I do not know what to make of the mild      increase in his PSA over the last month other than to say it may be      mild inflammation.  4. Peripheral vascular disease.  Patient with a very high level of concern      in this regard.  Because of his Life-Scan abnormalities we will set      him up for carotid Doppler study.  Same being, patient did have a heel      scan with a negative T-score of 1.1 which raises his concern.  Patient      will be set up for a full duel energy x-ray absorptiometry scan of      lumbar vertebra and hips.  5. Health maintenance.  Patient is current with gastroenterology with last      exam being June 13, 2003 with followup recommended April 2008.  Patient      will be scheduled for carotid Dopplers as noted.   SUMMARY:  Pleasant gentleman seems to be hemodynamically stable at this  time.  He will be notified as the scheduling for his carotid study.  He will be notified by phone if we have results of his duel energy  x-ray  absorptiometry scan and carotid study.  [name], MD   MEN/MedQ  DD:  09/03/2005  DT:  09/04/2005  Job #:  [id number]   cc:   Nelida Gores, MD  Mickel Baas

## 2010-10-18 ENCOUNTER — Ambulatory Visit (INDEPENDENT_AMBULATORY_CARE_PROVIDER_SITE_OTHER)
Admission: RE | Admit: 2010-10-18 | Discharge: 2010-10-18 | Disposition: A | Discharge status: Home / Self Care | Payer: Medicare Other | Source: Ambulatory Visit | Attending: Internal Medicine | Admitting: Internal Medicine

## 2010-10-18 ENCOUNTER — Ambulatory Visit (INDEPENDENT_AMBULATORY_CARE_PROVIDER_SITE_OTHER): Payer: Medicare Other | Admitting: Internal Medicine

## 2010-10-18 ENCOUNTER — Other Ambulatory Visit: Payer: Self-pay | Admitting: Internal Medicine

## 2010-10-18 ENCOUNTER — Other Ambulatory Visit (INDEPENDENT_AMBULATORY_CARE_PROVIDER_SITE_OTHER): Payer: Medicare Other

## 2010-10-18 DIAGNOSIS — R7309 Other abnormal glucose: Secondary | ICD-10-CM

## 2010-10-18 DIAGNOSIS — J988 Other specified respiratory disorders: Secondary | ICD-10-CM

## 2010-10-18 DIAGNOSIS — J22 Unspecified acute lower respiratory infection: Secondary | ICD-10-CM

## 2010-10-18 DIAGNOSIS — E785 Hyperlipidemia, unspecified: Secondary | ICD-10-CM

## 2010-10-18 DIAGNOSIS — J209 Acute bronchitis, unspecified: Secondary | ICD-10-CM

## 2010-10-18 LAB — LIPID PANEL
Cholesterol: 156 mg/dL (ref 0–200)
HDL: 22.5 mg/dL — ABNORMAL LOW (ref >39.00)
Total CHOL/HDL Ratio: 7
Triglycerides: 215 mg/dL — ABNORMAL HIGH (ref 0.0–149.0)
VLDL: 43 mg/dL — ABNORMAL HIGH (ref 0.0–40.0)

## 2010-10-18 LAB — CBC WITH DIFFERENTIAL/PLATELET
Basophils Absolute: 0.1 10*3/uL (ref 0.0–0.1)
Eosinophils Absolute: 0.2 10*3/uL (ref 0.0–0.7)
Hemoglobin: 15 g/dL (ref 13.0–17.0)
Lymphocytes Relative: 14.2 % (ref 12.0–46.0)
MCHC: 33.5 g/dL (ref 30.0–36.0)
Neutro Abs: 6.6 10*3/uL (ref 1.4–7.7)
Neutrophils Relative %: 72.7 % (ref 43.0–77.0)
Platelets: 313 10*3/uL (ref 150.0–400.0)
RDW: 13.9 % (ref 11.5–14.6)

## 2010-10-18 LAB — HEMOGLOBIN A1C: Hgb A1c MFr Bld: 6 % (ref 4.6–6.5)

## 2010-10-18 MED ORDER — DOXYCYCLINE HYCLATE 100 MG PO TABS: 100.0000 mg | ORAL_TABLET | Freq: Two times a day (BID) | ORAL | Status: AC

## 2010-10-18 NOTE — Patient Instructions (Signed)
Lower respiratory infection: broncitis vs Left lower lobe pneumonia. Plan - doxycycline 100 mg twice a day for 10 days; robitussin DM or the equivalent; for pressure in the ears generic sudafed 30 mg two or three times a day. Hydrate and rest.  High blood sugar - for A1C today  Cholesterol problems - will check labs today.

## 2010-10-18 NOTE — Progress Notes (Signed)
  Subjective:    Patient ID: Edward English, male    DOB: 1933-06-27, 77 y.o.   MRN: [id number]  HPI Edward English presents with a one week h/o cough with productive sputum, mild SOB. No fever. No lymphadenopathy. He has been feeling low on energy. He has not taken any medication for his symptoms. NO loss of hearing but a dullness is reported.  I have reviewed the patient's medical history in detail and updated the computerized patient record.    Review of Systems System review is negative for any constitutional, cardiac, pulmonary, GI or neuro symptoms or complaints other than as described in the HPI.     Objective:   Physical Exam Vitals noted  No fever Gen'l - heavyset white man in no distress Chest - rales at the left base with positive dullness to percussion.. Cor - RRR  Clinical Data: 77 year old male with decreased breath sounds,  abnormal auscultation at the left lung base. Cough. Former  smoker.  CHEST - 2 VIEW  Comparison: Rib series 09/24/2006.  Findings: Large lung volumes. Attenuation of vascular markings in  the upper lobes. No pneumothorax, pulmonary edema, pleural  effusion or confluent pulmonary opacity. Cardiac size and  mediastinal contours are within normal limits. Visualized tracheal  air column is within normal limits. No acute osseous abnormality  identified.  IMPRESSION:  Pulmonary hyperinflation. No acute cardiopulmonary abnormality.  Original Report Authenticated By: Randall An, M.D.      Assessment & Plan:  Acute bronchitis - x-rtay without infiltrate.   Plan - doxycycline 100 mg bid x 7. Antitussive treatment

## 2010-10-19 LAB — LDL CHOLESTEROL, DIRECT: Direct LDL: 103.8 mg/dL

## 2010-10-21 NOTE — Assessment & Plan Note (Signed)
Lipid panel with LDL 103.8 - adequate control.   Plan - continue present medications.

## 2010-10-21 NOTE — Assessment & Plan Note (Signed)
Repeat A1C 6%   Plan - life-style management - no sugar, low carb diet.

## 2010-10-22 ENCOUNTER — Other Ambulatory Visit: Payer: Self-pay | Admitting: Internal Medicine

## 2010-10-24 ENCOUNTER — Telehealth: Payer: Self-pay | Admitting: *Deleted

## 2010-10-24 NOTE — Telephone Encounter (Signed)
Left mess for patient to call back.  

## 2010-10-24 NOTE — Telephone Encounter (Signed)
Pt left vm requesting recent lab and CXR results. Please advise.

## 2010-10-24 NOTE — Telephone Encounter (Signed)
Chest Xray - normal. Lipid HDL low 22.5, LDL ok @ 103. DM - A1C 6% Great. CBC normal. Feeling better?

## 2010-10-25 NOTE — Telephone Encounter (Signed)
Pt informed of below. Copies of CXR and labs mailed to pt.

## 2011-03-17 ENCOUNTER — Other Ambulatory Visit: Payer: Self-pay | Admitting: Internal Medicine

## 2011-03-27 DIAGNOSIS — R350 Frequency of micturition: Secondary | ICD-10-CM | POA: Insufficient documentation

## 2011-04-15 ENCOUNTER — Other Ambulatory Visit: Payer: Self-pay | Admitting: Internal Medicine

## 2011-05-10 ENCOUNTER — Encounter: Payer: Self-pay | Admitting: Internal Medicine

## 2011-06-28 ENCOUNTER — Encounter: Payer: Self-pay | Admitting: Internal Medicine

## 2011-06-28 ENCOUNTER — Ambulatory Visit (INDEPENDENT_AMBULATORY_CARE_PROVIDER_SITE_OTHER): Payer: Medicare Other | Admitting: Internal Medicine

## 2011-06-28 VITALS — BP 110/60 | HR 80 | Temp 98.2°F | Resp 16 | Ht 69.0 in | Wt 201.0 lb

## 2011-06-28 DIAGNOSIS — R7309 Other abnormal glucose: Secondary | ICD-10-CM

## 2011-06-28 DIAGNOSIS — E785 Hyperlipidemia, unspecified: Secondary | ICD-10-CM

## 2011-06-28 DIAGNOSIS — Z0000 Encounter for general adult medical examination without abnormal findings: Secondary | ICD-10-CM

## 2011-06-28 DIAGNOSIS — N318 Other neuromuscular dysfunction of bladder: Secondary | ICD-10-CM

## 2011-06-28 DIAGNOSIS — G609 Hereditary and idiopathic neuropathy, unspecified: Secondary | ICD-10-CM

## 2011-06-28 DIAGNOSIS — Z87898 Personal history of other specified conditions: Secondary | ICD-10-CM

## 2011-06-28 MED ORDER — GABAPENTIN 300 MG PO CAPS: 300.0000 mg | ORAL_CAPSULE | Freq: Four times a day (QID) | ORAL | Status: DC

## 2011-06-28 NOTE — Progress Notes (Signed)
Subjective:    Patient ID: Edward English, male    DOB: Nov 06, 1933, 78 y.o.   MRN: [id number]  HPI Edward English  is here for annual Medicare wellness examination and management of other chronic and acute problems. He would like to take a holiday from metformin. He did have a colonoscopy in fall '12 and just had a follow-up study June 18th.    The risk factors are reflected in the social history.  The roster of all physicians providing medical care to patient - is listed in the Snapshot section of the chart.  Activities of daily living:  The patient is 100% inedpendent in all ADLs: dressing, toileting, feeding as well as independent mobility  Home safety : The patient has smoke detectors in the home. Fall - home is fall safe. They wear seatbelts.  firearms are present in the home, kept in a safe fashion. There is no violence in the home.   There is no risks for hepatitis, STDs or HIV. There is no   history of blood transfusion. They have no travel history to infectious disease endemic areas of the world.  The patient has seen their dentist in the last six month. They have seen their eye doctor in the last year. They admit to hearing difficulty and have not had audiologic testing in the last year.  They do not  have excessive sun exposure. Discussed the need for sun protection: hats, long sleeves and use of sunscreen if there is significant sun exposure.   Diet: the importance of a healthy diet is discussed. They do have a healthy diet.  The patient has no regular exercise program.  The benefits of regular aerobic exercise were discussed.  Depression screen: there are no signs or vegative symptoms of depression- irritability, change in appetite, anhedonia, sadness/tearfullness.  Cognitive assessment: the patient manages all their financial and personal affairs and is actively engaged. He does have episodes of feeling in a fog. Has low T but when taking replacement therapy develop  polycythemia  The following portions of the patient's history were reviewed and updated as appropriate: allergies, current medications, past family history, past medical history,  past surgical history, past social history  and problem list.  Vision, hearing, body mass index were assessed and reviewed.   During the course of the visit the patient was educated and counseled about appropriate screening and preventive services including : fall prevention , diabetes screening, nutrition counseling, colorectal cancer screening, and recommended immunizations.  Past Medical History  Diagnosis Date  . BENIGN PROSTATIC HYPERTROPHY, HX OF 02/17/2007    Qualifier: Diagnosis of  By: [place], [place]    . DIVERTICULITIS-COLON 02/04/2008    Qualifier: Diagnosis of  By: Sharlett Iles MD Byrd Hesselbach ERECTILE DYSFUNCTION, MILD 02/17/2007    Qualifier: Diagnosis of  By: [place], [place]    . HYPERGLYCEMIA 02/17/2007    Qualifier: Diagnosis of  By: Danny Lawless CMA, [place]    . HYPERLIPIDEMIA 02/17/2007    Qualifier: Diagnosis of  By: Danny Lawless CMA, [place]    . HYPOGONADISM 02/17/2007    Qualifier: Diagnosis of  By: Danny Lawless CMA, [place]    . OVERACTIVE BLADDER 02/17/2007    Qualifier: Diagnosis of  By: Danny Lawless CMA, [place]    . PERIPHERAL NEUROPATHY 02/17/2007    Qualifier: Diagnosis of  By: [place], [place]    . PERIPHERAL VASCULAR DISEASE 02/17/2007    Qualifier: Diagnosis of  By: [place], [place]    . Regional  enteritis of large intestine 06/23/2008    Qualifier: Diagnosis of  By: Sharlett Iles MD FACG, [place], HX OF 12/26/2009    Qualifier: Diagnosis of  By: Bull[place] CMA, [place]    . TONSILLECTOMY, HX OF 02/17/2007    Qualifier: Diagnosis of  By: [place], [place]     Past Surgical History  Procedure Date  . Cervical hemilaminectomy     C5-6, C6-7  . Shoulder arthroscopy   . Septoplasty   . Dupuytrens contracture relief   . Tonsillectomy   . Ectropion correction 2010    Left lower  eye lid   Family History  Problem Relation Age of Onset  . Diabetes Mother   . Alzheimer's disease Father   . Colon cancer Neg Hx   . Prostate cancer Neg Hx   . Stroke Neg Hx   . Coronary artery disease Neg Hx    History   Social History  . Marital Status: Single    Spouse Name: N/A    Number of Children: N/A  . Years of Education: N/A   Occupational History  . Not on file.   Social History Main Topics  . Smoking status: Former Research scientist (life sciences)  . Smokeless tobacco: Not on file  . Alcohol Use: Yes  . Drug Use: No  . Sexually Active:    Other Topics Concern  . Not on file   Social History Narrative   HS[place], [place] '[place] childrenWork: retiredEnd-of-life: Full code but heroic measures or futile care    Current Outpatient Prescriptions on File Prior to Visit  Medication Sig Dispense Refill  . acetaminophen (TYLENOL) 500 MG tablet Take 500 mg by mouth every 6 (six) hours as needed.      Marland Kitchen EPINEPHrine (EPI-PEN) 0.3 mg/0.3 mL DEVI Inject 0.3 mg into the muscle once.      . lovastatin (MEVACOR) 20 MG tablet TAKE ONE TABLET BY MOUTH IN THE EVENING  90 tablet  1  . mesalamine (ASACOL) 400 MG EC tablet Take 800 mg by mouth 2 (two) times daily.      . metFORMIN (GLUCOPHAGE) 500 MG tablet TAKE ONE TABLET BY MOUTH TWICE DAILY  180 tablet  1  . Tamsulosin HCl (FLOMAX) 0.4 MG CAPS Take by mouth daily.      Marland Kitchen tolterodine (DETROL) 2 MG tablet Take 2 mg by mouth 2 (two) times daily.      Marland Kitchen DISCONTD: gabapentin (NEURONTIN) 300 MG capsule Take 300 mg by mouth 3 (three) times daily.           Review of Systems Constitutional:  Negative for fever, chills, activity change and unexpected weight change.  HEENT:  Negative for hearing loss, ear pain, congestion, neck stiffness and postnasal drip. Negative for sore throat or swallowing problems. Negative for dental complaints.   Eyes: Negative for vision loss or change in visual acuity.  Respiratory: Negative for chest tightness and  wheezing. Negative for DOE.   Cardiovascular: Negative for chest pain or palpitations. No decreased exercise tolerance Gastrointestinal: No change in bowel habit. No bloating or gas. No reflux or indigestion Genitourinary: Negative for urgency, flank pain. He does have urinary frequency and nocturia x 4.  Musculoskeletal: Negative for myalgias, arthralgias and gait problem.  Neurological: Negative for dizziness, tremors, weakness and headaches.  Hematological: Negative for adenopathy.  Psychiatric/Behavioral: Negative for behavioral problems and dysphoric mood.   Lab Results  Component Value Date   WBC 9.1 10/18/2010   HGB 15.0 10/18/2010  HCT 44.7 10/18/2010   PLT 313.0 10/18/2010   GLUCOSE 81 11/22/2008   CHOL 156 10/18/2010   TRIG 215.0* 10/18/2010   HDL 22.50* 10/18/2010   LDLDIRECT 103.8 10/18/2010   LDLCALC 97 12/26/2009   ALT 21 12/26/2009   AST 18 12/26/2009   NA 142 11/22/2008   K 4.5 11/22/2008   CL 104 11/22/2008   CREATININE 0.8 11/22/2008   BUN 9 11/22/2008   CO2 29 11/22/2008   TSH 3.02 09/24/2006   PSA 2.17 11/22/2008   HGBA1C 6.0 10/18/2010       Objective:   Physical Exam Filed Vitals:   06/28/11 1444  BP: 110/60  Pulse: 80  Temp: 98.2 F (36.8 C)  Resp: 16   Wt Readings from Last 3 Encounters:  06/28/11 201 lb (91.173 kg)  12/26/09 208 lb (94.348 kg)  12/20/08 208 lb (94.348 kg)    Gen'l: Well nourished well developed white male in no acute distress  HEENT: Head: Normocephalic and atraumatic. Right Ear: External ear normal. EAC/TM nl. Left Ear: External ear normal.  EAC/TM nl. Nose: Nose normal. Mouth/Throat: Oropharynx is clear and moist. Dentition - native, in good repair. No buccal or palatal lesions. Posterior pharynx clear. Eyes: Conjunctivae and sclera clear. EOM intact. Pupils are equal, round, and reactive to light. Right eye exhibits no discharge. Left eye exhibits no discharge. Neck: Normal range of motion. Neck supple. No JVD present.  No tracheal deviation present. No thyromegaly present.  Cardiovascular: Normal rate, regular rhythm, no gallop, no friction rub, no murmur heard.      Quiet precordium. 2+ radial and DP pulses . No carotid bruits Pulmonary/Chest: Effort normal. No respiratory distress or increased WOB, no wheezes, no rales. No chest wall deformity or CVAT. Abdominal: Soft. Bowel sounds are normal in all quadrants. He exhibits no distension, no tenderness, no rebound or guarding, No heptosplenomegaly  Genitourinary:  deferred Musculoskeletal: Normal range of motion. He exhibits no edema and no tenderness.       Small and large joints without redness, synovial thickening or deformity. Full range of motion preserved about all small, median and large joints.  Lymphadenopathy:    He has no cervical or supraclavicular adenopathy.  Neurological: He is alert and oriented to person, place, and time. CN II-XII intact. DTRs 2+ and symmetrical biceps, radial and patellar tendons. Cerebellar function normal with no tremor, rigidity, normal gait and station.  Skin: Skin is warm and dry. No rash noted. No erythema.  Psychiatric: He has a normal mood and affect. His behavior is normal. Thought content normal.            Assessment & Plan:

## 2011-06-30 ENCOUNTER — Encounter: Payer: Self-pay | Admitting: Internal Medicine

## 2011-06-30 DIAGNOSIS — Z0000 Encounter for general adult medical examination without abnormal findings: Secondary | ICD-10-CM | POA: Insufficient documentation

## 2011-06-30 NOTE — Assessment & Plan Note (Signed)
Has had persistent symptoms that are slightly worse.  Plan Resume gabapentin.

## 2011-06-30 NOTE — Assessment & Plan Note (Addendum)
Last lipid panel 8 months ago revealed LDL better than goal of 130 or less but a very low HDL.  Plan Low carb diet and regular exercise to help raise the HDL  Continue low dose lovastatin.  F/u lab in 3 months

## 2011-06-30 NOTE — Assessment & Plan Note (Addendum)
Lab Results  Component Value Date   HGBA1C 6.0 10/18/2010   No evidence or history to suggest symptomatic hyperglycemia while on metformin  Plan  OK for drug holiday - may stop metformin  Prudent low sugar low carb diet and regular exercise.  Follow- up lab in 12+ weeks.

## 2011-06-30 NOTE — Assessment & Plan Note (Signed)
Interval medical history is benign. Physical exam is normal. Previous labs are unremarkable with follow labs on or after Sept 23rd. He is current with colorectal cancer screening. Last PSA normal. Discussed pros and cons of prostate cancer screening (USPHCTF recommendations reviewed and ACU April '13 recommendations) and he has per these guidelines aged out from further screening.  In summary - a nice man who is medically stable. He is encouraged to resume his exercise program. He will return for lab in 3 months (on or after Sept 23rd) otherwise he will return as needed.

## 2011-06-30 NOTE — Assessment & Plan Note (Addendum)
Stable with no c/o of interruption of sleep or urinary problems.  Plan Continue flomax.

## 2011-06-30 NOTE — Assessment & Plan Note (Signed)
Continues on Detrol with good control of symptoms

## 2011-09-23 ENCOUNTER — Other Ambulatory Visit: Payer: Self-pay | Admitting: Internal Medicine

## 2011-09-26 ENCOUNTER — Other Ambulatory Visit (INDEPENDENT_AMBULATORY_CARE_PROVIDER_SITE_OTHER): Payer: Medicare Other

## 2011-09-26 DIAGNOSIS — R7309 Other abnormal glucose: Secondary | ICD-10-CM

## 2011-09-26 DIAGNOSIS — E785 Hyperlipidemia, unspecified: Secondary | ICD-10-CM

## 2011-09-26 LAB — HEMOGLOBIN A1C: Hgb A1c MFr Bld: 5.8 % (ref 4.6–6.5)

## 2011-09-26 LAB — HEPATIC FUNCTION PANEL
ALT: 24 U/L (ref 0–53)
AST: 21 U/L (ref 0–37)
Alkaline Phosphatase: 58 U/L (ref 39–117)
Total Bilirubin: 1.2 mg/dL (ref 0.3–1.2)

## 2011-09-26 LAB — LIPID PANEL: HDL: 22.6 mg/dL — ABNORMAL LOW (ref >39.00)

## 2011-09-29 ENCOUNTER — Encounter: Payer: Self-pay | Admitting: Internal Medicine

## 2011-12-20 DIAGNOSIS — R7303 Prediabetes: Secondary | ICD-10-CM | POA: Insufficient documentation

## 2011-12-23 ENCOUNTER — Other Ambulatory Visit: Payer: Self-pay | Admitting: Internal Medicine

## 2012-01-10 DIAGNOSIS — N400 Enlarged prostate without lower urinary tract symptoms: Secondary | ICD-10-CM | POA: Insufficient documentation

## 2012-03-16 ENCOUNTER — Other Ambulatory Visit: Payer: Self-pay | Admitting: Internal Medicine

## 2012-09-13 ENCOUNTER — Other Ambulatory Visit: Payer: Self-pay | Admitting: Internal Medicine

## 2012-11-19 ENCOUNTER — Encounter: Payer: Self-pay | Admitting: Internal Medicine

## 2012-11-19 ENCOUNTER — Ambulatory Visit (INDEPENDENT_AMBULATORY_CARE_PROVIDER_SITE_OTHER)
Admission: RE | Admit: 2012-11-19 | Discharge: 2012-11-19 | Disposition: A | Discharge status: Home / Self Care | Payer: Medicare Other | Source: Ambulatory Visit | Attending: Internal Medicine | Admitting: Internal Medicine

## 2012-11-19 ENCOUNTER — Ambulatory Visit (INDEPENDENT_AMBULATORY_CARE_PROVIDER_SITE_OTHER): Payer: Medicare Other | Admitting: Internal Medicine

## 2012-11-19 VITALS — BP 138/76 | HR 67 | Temp 97.6°F | Ht 69.0 in | Wt 200.0 lb

## 2012-11-19 DIAGNOSIS — M25561 Pain in right knee: Secondary | ICD-10-CM

## 2012-11-19 DIAGNOSIS — N318 Other neuromuscular dysfunction of bladder: Secondary | ICD-10-CM

## 2012-11-19 DIAGNOSIS — E785 Hyperlipidemia, unspecified: Secondary | ICD-10-CM

## 2012-11-19 DIAGNOSIS — M25551 Pain in right hip: Secondary | ICD-10-CM

## 2012-11-19 DIAGNOSIS — M25569 Pain in unspecified knee: Secondary | ICD-10-CM

## 2012-11-19 DIAGNOSIS — E291 Testicular hypofunction: Secondary | ICD-10-CM

## 2012-11-19 DIAGNOSIS — G609 Hereditary and idiopathic neuropathy, unspecified: Secondary | ICD-10-CM

## 2012-11-19 DIAGNOSIS — Z0000 Encounter for general adult medical examination without abnormal findings: Secondary | ICD-10-CM

## 2012-11-19 DIAGNOSIS — M25559 Pain in unspecified hip: Secondary | ICD-10-CM

## 2012-11-19 DIAGNOSIS — R7309 Other abnormal glucose: Secondary | ICD-10-CM

## 2012-11-19 DIAGNOSIS — Z87898 Personal history of other specified conditions: Secondary | ICD-10-CM

## 2012-11-19 MED ORDER — PREGABALIN 75 MG PO CAPS: 75.0000 mg | ORAL_CAPSULE | Freq: Two times a day (BID) | ORAL | Status: DC

## 2012-11-19 NOTE — Patient Instructions (Signed)
Good to see you.  Please bring in a copy of your lab work from the [place] so we can scan it into the record.  Your exam today is normal except for the loss of sensation in the feet.  You are current on colorectal cancer screening. Immunizations are current except you are a candidate for a companion pneumonia vaccine - Prevnar. You can make a nurse visit in a couple of weeks for this.  The right knee looks OK but suspect there is moderate degenerative joint disease. Will check X-ray today - result will be posted to MyChart. For the hip pain will check bilateral hip films - results to MyChart  Overall you seem to be doing well. It is important that you resume an exercise program. Think of it as your job - go to work for health at least 3 times a week.

## 2012-11-19 NOTE — Progress Notes (Signed)
Pre visit review using our clinic review tool, if applicable. No additional management support is needed unless otherwise documented below in the visit note. 

## 2012-11-19 NOTE — Progress Notes (Signed)
Subjective:    Patient ID: Edward English, male    DOB: 1933-07-20, 79 y.o.   MRN: [id number]  HPI The patient is here for annual Medicare wellness examination and management of other chronic and acute problems.  Interval history - had TUR-P Jan 3, '14. He has less nocturia but still with frequency. He will follow up with Dr. [name] next month. His ED is worse since surgery.  He reports that his left nipple has been sore over the past several months. He denies any nipple discharge, denies any trauma, denies any swelling.  He is having progressive right knee pain and threatened collapse but no fall. He has also been having hip pain and stiffness.   The risk factors are reflected in the social history.  The roster of all physicians providing medical care to patient - is listed in the Snapshot section of the chart.  Activities of daily living:  The patient is 100% inedpendent in all ADLs: dressing, toileting, feeding as well as independent mobility  Home safety : The patient has smoke detectors in the home. Falls - had a stumble/fall Nov 4th-caught up in a cord. They wear seatbelts.  firearms are present in the home, kept in a safe fashion. There is no violence in the home.   There is no risks for hepatitis, STDs or HIV. There is no history of blood transfusion. They have no travel history to infectious disease endemic areas of the world.  The patient has seen their dentist in the last six month. They have seen their eye doctor in the last year. They admit hearing difficulty, wears hearing aides and did have audiologic testing in the last year.    They do not  have excessive sun exposure. Discussed the need for sun protection: hats, long sleeves and use of sunscreen if there is significant sun exposure.   Diet: the importance of a healthy diet is discussed. They do have a healthy diet.  The patient has a regular exercise program.  The benefits of regular aerobic exercise were  discussed.  Depression screen: there are no signs or vegative symptoms of depression- irritability, change in appetite, anhedonia, sadness/tearfullness.  Cognitive assessment: the patient manages all their financial and personal affairs and is actively engaged.   The following portions of the patient's history were reviewed and updated as appropriate: allergies, current medications, past family history, past medical history,  past surgical history, past social history  and problem list.  Past Medical History  Diagnosis Date  . BENIGN PROSTATIC HYPERTROPHY, HX OF 02/17/2007    Qualifier: Diagnosis of  By: [place], [place]    . DIVERTICULITIS-COLON 02/04/2008    Qualifier: Diagnosis of  By: Sharlett Iles MD Byrd Hesselbach ERECTILE DYSFUNCTION, MILD 02/17/2007    Qualifier: Diagnosis of  By: [place], [place]    . HYPERGLYCEMIA 02/17/2007    Qualifier: Diagnosis of  By: Danny Lawless CMA, [place]    . HYPERLIPIDEMIA 02/17/2007    Qualifier: Diagnosis of  By: Danny Lawless CMA, [place]    . HYPOGONADISM 02/17/2007    Qualifier: Diagnosis of  By: Danny Lawless CMA, [place]    . OVERACTIVE BLADDER 02/17/2007    Qualifier: Diagnosis of  By: Danny Lawless CMA, [place]    . PERIPHERAL NEUROPATHY 02/17/2007    Qualifier: Diagnosis of  By: [place], [place]    . PERIPHERAL VASCULAR DISEASE 02/17/2007    Qualifier: Diagnosis of  By: [place], [place]    . Regional enteritis of large  intestine 06/23/2008    Qualifier: Diagnosis of  By: Sharlett Iles MD FACG, [place], HX OF 12/26/2009    Qualifier: Diagnosis of  By: Bullins CMA, [place]    . TONSILLECTOMY, HX OF 02/17/2007    Qualifier: Diagnosis of  By: [place], [place]     Past Surgical History  Procedure Laterality Date  . Cervical hemilaminectomy      C5-6, C6-7  . Shoulder arthroscopy    . Septoplasty    . Dupuytrens contracture relief    . Tonsillectomy    . Ectropion correction  2010    Left lower eye lid   Family History  Problem Relation Age  of Onset  . Diabetes Mother   . Alzheimer's disease Father   . Colon cancer Neg Hx   . Prostate cancer Neg Hx   . Stroke Neg Hx   . Coronary artery disease Neg Hx    History   Social History  . Marital Status: Single    Spouse Name: N/A    Number of Children: 0  . Years of Education: 16   Occupational History  . retired    Social History Main Topics  . Smoking status: Former Research scientist (life sciences)  . Smokeless tobacco: Never Used  . Alcohol Use: Yes  . Drug Use: No  . Sexual Activity: Not on file   Other Topics Concern  . Not on file   Social History Narrative   HSG, [employer]-undergrad. Married '67-'87. He is in a long term relationship (June '13). No children. Work: retired. End-of-life: Full code but no prolonged heroic measures or futile care             Current Outpatient Prescriptions on File Prior to Visit  Medication Sig Dispense Refill  . acetaminophen (TYLENOL) 500 MG tablet Take 500 mg by mouth every 6 (six) hours as needed.      Marland Kitchen EPINEPHrine (EPI-PEN) 0.3 mg/0.3 mL DEVI Inject 0.3 mg into the muscle once.      . lovastatin (MEVACOR) 20 MG tablet TAKE ONE TABLET BY MOUTH IN THE EVENING  90 tablet  0  . mesalamine (ASACOL) 400 MG EC tablet Take 800 mg by mouth 2 (two) times daily.      Marland Kitchen tolterodine (DETROL) 2 MG tablet Take 2 mg by mouth 2 (two) times daily.       No current facility-administered medications on file prior to visit.     Vision, hearing, body mass index were assessed and reviewed.   During the course of the visit the patient was educated and counseled about appropriate screening and preventive services including : fall prevention , diabetes screening, nutrition counseling, colorectal cancer screening, and recommended immunizations.    Review of Systems Constitutional:  Negative for fever, chills, activity change and unexpected weight change.  HEENT:  Negative for hearing loss, ear pain, congestion, neck stiffness and postnasal drip. Negative for  sore throat or swallowing problems. Negative for dental complaints - did have a root canal.   Eyes: Negative for vision loss or change in visual acuity.  Respiratory: Negative for chest tightness and wheezing. Negative for DOE.   Cardiovascular: Negative for chest pain or palpitations. No decreased exercise tolerance Gastrointestinal: No change in bowel habit. No bloating or gas. Minor reflux or indigestion for which he takes no med.  Genitourinary: Negative for urgency, flank pain and difficulty urinating. Has frequency and nocturia - but less since TUR-P Musculoskeletal: Negative for myalgias, back pain, arthralgias that  limit activity and no gait problem.  Neurological: Negative for dizziness, tremors, weakness and headaches.  Hematological: Negative for adenopathy.  Psychiatric/Behavioral: Negative for behavioral problems and dysphoric mood.       Objective:   Physical Exam Filed Vitals:   11/19/12 0901  BP: 138/76  Pulse: 67  Temp: 97.6 F (36.4 C)   Wt Readings from Last 3 Encounters:  11/19/12 200 lb (90.719 kg)  06/28/11 201 lb (91.173 kg)  12/26/09 208 lb (94.348 kg)   Gen'l: Well nourished well developed white male in no acute distress  HEENT: Head: Normocephalic and atraumatic. Right Ear: External ear normal. EAC/TM nl. Left Ear: External ear normal.  EAC/TM nl. Nose: Nose normal. Mouth/Throat: Oropharynx is clear and moist. Dentition - native, in good repair. No buccal or palatal lesions. Posterior pharynx clear. Eyes: Conjunctivae and sclera clear. EOM intact. Pupils are equal, round, and reactive to light. Right eye exhibits no discharge. Left eye exhibits no discharge. Neck: Normal range of motion. Neck supple. No JVD present. No tracheal deviation present. No thyromegaly present.  Cardiovascular: Normal rate, regular rhythm, no gallop, no friction rub, no murmur heard.      Quiet precordium. 2+ radial and DP pulses . No carotid bruits Pulmonary/Chest: Effort normal. No  respiratory distress or increased WOB, no wheezes, no rales. No chest wall deformity or CVAT. Abdomen: Soft. Bowel sounds are normal in all quadrants. He exhibits no distension, no tenderness, no rebound or guarding, No heptosplenomegaly  Genitourinary:  deferred to Dr. [name], [facility] Musculoskeletal: Normal range of motion. He exhibits no edema and no tenderness.       Small and large joints without redness, synovial thickening or deformity. Full range of motion preserved about all small, median and large joints. Right knee with full ROM, tender to palpation at the inferior medial joint line. No tendonous tenderness Lymphadenopathy:    He has no cervical or supraclavicular adenopathy.  Neurological: He is alert and oriented to person, place, and time. CN II-XII intact. DTRs 2+ and symmetrical biceps, radial and patellar tendons. Cerebellar function normal with no tremor, rigidity, normal gait and station.  Skin: Skin is warm and dry. No rash noted. No erythema.  Psychiatric: He has a normal mood and affect. His behavior is normal. Thought content normal.   Bilateral hip x-ray: FINDINGS:  There are tiny marginal osteophytes on the femoral heads. There is  no joint space narrowing or significant osteophyte formation on the  acetabuli. Pelvic bones appear normal. Degenerative changes are  present in the lower lumbar spine.  IMPRESSION:  Minimal arthritic changes of both hips.  Knee X-ray: FINDINGS:  Four views of the right knee submitted. No acute fracture or  subluxation. Narrowing of patellofemoral joint space.  Atherosclerotic calcifications of femoral and popliteal artery. Old  fracture deformity of proximal fibula. There is focal sclerosis in  distal shaft of right femur probable prior avascular necrosis.  IMPRESSION:  No acute fracture or subluxation. Degenerative changes as described  above. Old fracture deformity of proximal fibula.        Assessment & Plan:  MSK - some hip and  knee pain with normal x-rays. Plan Routine care with OTC meds as needed.

## 2012-11-21 NOTE — Assessment & Plan Note (Signed)
Interval history w/o major illness. Physical exam-limited- is ok. He had lab work done at the [place] and copies are requested. By his report there were no significant abnormalities. He is current with colorectal cancer screening. He has aged out of prostate cancer screening (may have had PSA at [facility]). Immunizations - no record of shingles vaccine - may have had at [place], had recent flu shot, he is advised to have Prevnar and he will check on availability at the [place].  In summary A nice man who appears medically stable.

## 2012-11-21 NOTE — Assessment & Plan Note (Signed)
Dong well. Continue on medication.

## 2012-11-21 NOTE — Assessment & Plan Note (Signed)
Decreased sensation persists. Foot exam is w/o injury or lesions  Plan Patient advised to continue checking his feet daily for wounds

## 2012-11-21 NOTE — Assessment & Plan Note (Signed)
Rectal exam and labs deferred to [facility]

## 2012-11-21 NOTE — Assessment & Plan Note (Signed)
Patient does have labs at the [place] - no change: well controlled blood sugar

## 2012-11-21 NOTE — Assessment & Plan Note (Signed)
Continues on Detrol with some positive effect. He does not wish additional treatment or testing.

## 2012-11-21 NOTE — Assessment & Plan Note (Signed)
Takes no hormone replacement.

## 2012-12-22 ENCOUNTER — Ambulatory Visit: Payer: Self-pay

## 2012-12-22 DIAGNOSIS — Z23 Encounter for immunization: Secondary | ICD-10-CM

## 2012-12-27 ENCOUNTER — Other Ambulatory Visit: Payer: Self-pay | Admitting: Internal Medicine

## 2013-01-21 ENCOUNTER — Ambulatory Visit (INDEPENDENT_AMBULATORY_CARE_PROVIDER_SITE_OTHER): Payer: Managed Care, Other (non HMO) | Admitting: Internal Medicine

## 2013-01-21 ENCOUNTER — Encounter: Payer: Self-pay | Admitting: Internal Medicine

## 2013-01-21 VITALS — BP 110/72 | HR 78 | Temp 97.5°F | Wt 206.8 lb

## 2013-01-21 DIAGNOSIS — N6322 Unspecified lump in the left breast, upper inner quadrant: Secondary | ICD-10-CM

## 2013-01-21 DIAGNOSIS — R55 Syncope and collapse: Secondary | ICD-10-CM

## 2013-01-21 DIAGNOSIS — M76891 Other specified enthesopathies of right lower limb, excluding foot: Secondary | ICD-10-CM

## 2013-01-21 DIAGNOSIS — M6580 Other synovitis and tenosynovitis, unspecified site: Secondary | ICD-10-CM

## 2013-01-21 DIAGNOSIS — N63 Unspecified lump in unspecified breast: Secondary | ICD-10-CM

## 2013-01-21 NOTE — Patient Instructions (Signed)
1. Tendonitis at the insertion of the sartorius and gracilis muscles on the tibia Plan Cortisone injection : will start work in about 6-8 hours and work for a week on the inflamed tendons  If the pain comes back will have you see Dr. Hulan Saas, DO - sports medicine specialist  2. Breast lump left - this is probable a benign cyst - tender and mobile. Plan     Diagnostic mammogram  3. Loss of consciousness - vasovagal syncope = due to gut pain and sudden position change blood vessels dilate, blood pressure drops and you "swoon."  Vasovagal Syncope, Adult Syncope, commonly known as fainting, is a temporary loss of consciousness. It occurs when the blood flow to the brain is reduced. Vasovagal syncope (also called neurocardiogenic syncope) is a fainting spell in which the blood flow to the brain is reduced because of a sudden drop in heart rate and blood pressure. Vasovagal syncope occurs when the brain and the cardiovascular system (blood vessels) do not adequately communicate and respond to each other. This is the most common cause of fainting. It often occurs in response to fear or some other type of emotional or physical stress. The body has a reaction in which the heart starts beating too slowly or the blood vessels expand, reducing blood pressure. This type of fainting spell is generally considered harmless. However, injuries can occur if a person takes a sudden fall during a fainting spell.  CAUSES  Vasovagal syncope occurs when a person's blood pressure and heart rate decrease suddenly, usually in response to a trigger. Many things and situations can trigger an episode. Some of these include:   Pain.   Fear.   The sight of blood or medical procedures, such as blood being drawn from a vein.   Common activities, such as coughing, swallowing, stretching, or going to the bathroom.   Emotional stress.   Prolonged standing, especially in a warm environment.   Lack of sleep or rest.    Prolonged lack of food.   Prolonged lack of fluids.   Recent illness.  The use of certain drugs that affect blood pressure, such as cocaine, alcohol, marijuana, inhalants, and opiates.  SYMPTOMS  Before the fainting episode, you may:   Feel dizzy or light headed.   Become pale.  Sense that you are going to faint.   Feel like the room is spinning.   Have tunnel vision, only seeing directly in front of you.   Feel sick to your stomach (nauseous).   See spots or slowly lose vision.   Hear ringing in your ears.   Have a headache.   Feel warm and sweaty.   Feel a sensation of pins and needles. During the fainting spell, you will generally be unconscious for no longer than a couple minutes before waking up and returning to normal. If you get up too quickly before your body can recover, you may faint again. Some twitching or jerky movements may occur during the fainting spell.  DIAGNOSIS  Your caregiver will ask about your symptoms, take a medical history, and perform a physical exam. Various tests may be done to rule out other causes of fainting. These may include blood tests and tests to check the heart, such as electrocardiography, echocardiography, and possibly an electrophysiology study. When other causes have been ruled out, a test may be done to check the body's response to changes in position (tilt table test). TREATMENT  Most cases of vasovagal syncope do not require treatment.  Your caregiver may recommend ways to avoid fainting triggers and may provide home strategies for preventing fainting. If you must be exposed to a possible trigger, you can drink additional fluids to help reduce your chances of having an episode of vasovagal syncope. If you have warning signs of an oncoming episode, you can respond by positioning yourself favorably (lying down). If your fainting spells continue, you may be given medicines to prevent fainting. Some medicines may help make  you more resistant to repeated episodes of vasovagal syncope. Special exercises or compression stockings may be recommended. In rare cases, the surgical placement of a pacemaker is considered. HOME CARE INSTRUCTIONS   Learn to identify the warning signs of vasovagal syncope.   Sit or lie down at the first warning sign of a fainting spell. If sitting, put your head down between your legs. If you lie down, swing your legs up in the air to increase blood flow to the brain.   Avoid hot tubs and saunas.  Avoid prolonged standing.  Drink enough fluids to keep your urine clear or pale yellow. Avoid caffeine.  Increase salt in your diet as directed by your caregiver.   If you have to stand for a long time, perform movements such as:   Crossing your legs.   Flexing and stretching your leg muscles.   Squatting.   Moving your legs.   Bending over.   Only take over-the-counter or prescription medicines as directed by your caregiver. Do not suddenly stop any medicines without asking your caregiver first. SEEK MEDICAL CARE IF:   Your fainting spells continue or happen more frequently in spite of treatment.   You lose consciousness for more than a couple minutes.  You have fainting spells during or after exercising or after being startled.   You have new symptoms that occur with the fainting spells, such as:   Shortness of breath.  Chest pain.   Irregular heartbeat.   You have episodes of twitching or jerky movements that last longer than a few seconds.  You have episodes of twitching or jerky movements without obvious fainting. SEEK IMMEDIATE MEDICAL CARE IF:   You have injuries or bleeding after a fainting spell.   You have episodes of twitching or jerky movements that last longer than 5 minutes.   You have more than one spell of twitching or jerky movements before returning to consciousness after fainting. MAKE SURE YOU:   Understand these  instructions.  Will watch your condition.  Will get help right away if you are not doing well or get worse. Document Released: 12/11/2011 Document Reviewed: 12/11/2011 [facility] Patient Information 2014 [place], [place].

## 2013-01-21 NOTE — Progress Notes (Signed)
Subjective:    Patient ID: Edward English, male    DOB: 1933-02-11, 80 y.o.   MRN: [id number]  HPI Still with a sore left nipple with some swelling. He also feels a lump behind the areola  He has questions about the right knee films from last visit - reviewed: decreased joint space patello-femoral region, sclerosis of the distal femur and old callus tibia from previous fracture. He is having pain in the area of the knee: medially.  He reports having an episode of LOC: after a big meal he took a nap. He awoke with a sudden need to defecate along with abdominal pain - got up quickly and went to the bathroom. He got to the bathroom, felt light-headed and then "crumpled" to the floor with a brief loss of consciousness. He had no incontinence and awoke quickly. He had no sequelae.   Past Medical History  Diagnosis Date  . BENIGN PROSTATIC HYPERTROPHY, HX OF 02/17/2007    Qualifier: Diagnosis of  By: [place], [place]    . DIVERTICULITIS-COLON 02/04/2008    Qualifier: Diagnosis of  By: Sharlett Iles MD Byrd Hesselbach ERECTILE DYSFUNCTION, MILD 02/17/2007    Qualifier: Diagnosis of  By: [place], [place]    . HYPERGLYCEMIA 02/17/2007    Qualifier: Diagnosis of  By: Danny Lawless CMA, [place]    . HYPERLIPIDEMIA 02/17/2007    Qualifier: Diagnosis of  By: Danny Lawless CMA, [place]    . HYPOGONADISM 02/17/2007    Qualifier: Diagnosis of  By: Danny Lawless CMA, [place]    . OVERACTIVE BLADDER 02/17/2007    Qualifier: Diagnosis of  By: Danny Lawless CMA, [place]    . PERIPHERAL NEUROPATHY 02/17/2007    Qualifier: Diagnosis of  By: [place], [place]    . PERIPHERAL VASCULAR DISEASE 02/17/2007    Qualifier: Diagnosis of  By: [place], [place]    . Regional enteritis of large intestine 06/23/2008    Qualifier: Diagnosis of  By: Sharlett Iles MD FACG, [place], HX OF 12/26/2009    Qualifier: Diagnosis of  By: Bullins CMA, [place]    . TONSILLECTOMY, HX OF 02/17/2007    Qualifier: Diagnosis of  By: [place],  [place]     Past Surgical History  Procedure Laterality Date  . Cervical hemilaminectomy      C5-6, C6-7  . Shoulder arthroscopy    . Septoplasty    . Dupuytrens contracture relief    . Tonsillectomy    . Ectropion correction  2010    Left lower eye lid   Family History  Problem Relation Age of Onset  . Diabetes Mother   . Alzheimer's disease Father   . Colon cancer Neg Hx   . Prostate cancer Neg Hx   . Stroke Neg Hx   . Coronary artery disease Neg Hx    History   Social History  . Marital Status: Single    Spouse Name: N/A    Number of Children: 0  . Years of Education: 16   Occupational History  . retired    Social History Main Topics  . Smoking status: Former Research scientist (life sciences)  . Smokeless tobacco: Never Used  . Alcohol Use: Yes  . Drug Use: No  . Sexual Activity: Not on file   Other Topics Concern  . Not on file   Social History Narrative   HSG, [employer]-under[employer]. Married '67-'87. He is in a long term relationship (June '13). No children. Work: retired. End-of-life: Full code  but no prolonged heroic measures or futile care             Current Outpatient Prescriptions on File Prior to Visit  Medication Sig Dispense Refill  . acetaminophen (TYLENOL) 500 MG tablet Take 500 mg by mouth every 6 (six) hours as needed.      Marland Kitchen EPINEPHrine (EPI-PEN) 0.3 mg/0.3 mL DEVI Inject 0.3 mg into the muscle once.      . lovastatin (MEVACOR) 20 MG tablet TAKE ONE TABLET BY MOUTH IN THE EVENING  90 tablet  3  . mesalamine (ASACOL) 400 MG EC tablet Take 800 mg by mouth 2 (two) times daily.      . pregabalin (LYRICA) 75 MG capsule Take 1 capsule (75 mg total) by mouth 2 (two) times daily.      Marland Kitchen tolterodine (DETROL) 2 MG tablet Take 2 mg by mouth 2 (two) times daily.       No current facility-administered medications on file prior to visit.    Review of Systems System review is negative for any constitutional, cardiac, pulmonary, GI or neuro symptoms or complaints other than as  described in the HPI.     Objective:   Physical Exam Filed Vitals:   01/21/13 1303  BP: 110/72  Pulse: 78  Temp: 97.5 F (36.4 C)   gen'l - WNWD man Cor - 2+ radial RRR Pulm - normal respirations Breast - Left. No nipple d/c, small cystic structure 2 cm diameter at 10 o'clock psotion - mobile and tender. MSK - right knee with normal movement, no crepitus. Point tenderness at the insertion of gracilis and sartorius at the tibia. Neuro - A&O x 3, CN II-XII normal, normal MS, normal gait.       Assessment & Plan:  1. Breast lump - mobile and tender = good signs Plan Diagnostic mammogram left breast.  2. Tendonitis at the insertion of the sartorius and gracilis muscles on the tibia Plan Cortisone injection : will start work in about 6-8 hours and work for a week on the inflamed tendons  If the pain comes back will have you see Dr. Hulan Saas, DO - sports medicine specialist  3. Loss of consciousness - vasovagal syncope = due to gut pain and sudden position change blood vessels dilate, blood pressure drops and you "swoon."

## 2013-01-21 NOTE — Progress Notes (Signed)
Pre visit review using our clinic review tool, if applicable. No additional management support is needed unless otherwise documented below in the visit note. 

## 2013-01-28 ENCOUNTER — Other Ambulatory Visit: Payer: Self-pay | Admitting: Dermatology

## 2013-01-29 ENCOUNTER — Encounter: Payer: Self-pay | Admitting: Internal Medicine

## 2013-02-02 ENCOUNTER — Other Ambulatory Visit: Payer: Self-pay | Admitting: Internal Medicine

## 2013-02-02 DIAGNOSIS — N6322 Unspecified lump in the left breast, upper inner quadrant: Secondary | ICD-10-CM

## 2013-02-03 ENCOUNTER — Other Ambulatory Visit: Payer: Managed Care, Other (non HMO)

## 2013-02-08 ENCOUNTER — Ambulatory Visit
Admission: RE | Admit: 2013-02-08 | Discharge: 2013-02-08 | Disposition: A | Discharge status: Home / Self Care | Payer: Managed Care, Other (non HMO) | Source: Ambulatory Visit | Attending: Internal Medicine | Admitting: Internal Medicine

## 2013-02-08 ENCOUNTER — Other Ambulatory Visit: Payer: Self-pay | Admitting: Internal Medicine

## 2013-02-08 ENCOUNTER — Ambulatory Visit
Admission: RE | Admit: 2013-02-08 | Discharge: 2013-02-08 | Disposition: A | Discharge status: Home / Self Care | Payer: Self-pay | Source: Ambulatory Visit | Attending: Internal Medicine | Admitting: Internal Medicine

## 2013-02-08 DIAGNOSIS — N6322 Unspecified lump in the left breast, upper inner quadrant: Secondary | ICD-10-CM

## 2013-05-14 ENCOUNTER — Encounter: Payer: Self-pay | Admitting: Internal Medicine

## 2013-05-14 ENCOUNTER — Ambulatory Visit (INDEPENDENT_AMBULATORY_CARE_PROVIDER_SITE_OTHER): Payer: Managed Care, Other (non HMO) | Admitting: Internal Medicine

## 2013-05-14 VITALS — BP 126/84 | HR 79 | Temp 98.2°F | Resp 14 | Wt 207.4 lb

## 2013-05-14 DIAGNOSIS — R05 Cough: Secondary | ICD-10-CM | POA: Insufficient documentation

## 2013-05-14 DIAGNOSIS — E291 Testicular hypofunction: Secondary | ICD-10-CM

## 2013-05-14 DIAGNOSIS — R059 Cough, unspecified: Secondary | ICD-10-CM

## 2013-05-14 DIAGNOSIS — N62 Hypertrophy of breast: Secondary | ICD-10-CM

## 2013-05-14 MED ORDER — MONTELUKAST SODIUM 10 MG PO TABS: 10.0000 mg | ORAL_TABLET | Freq: Every day | ORAL | Status: DC

## 2013-05-14 NOTE — Progress Notes (Signed)
Subjective:    Patient ID: Edward English, male    DOB: 1933/07/19, 80 y.o.   MRN: [id number]  HPI  He has had a cough for several weeks which has been stable. 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 It has also been affected by change in temperature. Coughing spells can last two - three minutes. This is associated with the need to clear his throat. He's also had associated itchy eyes and sneezing. Over-the-counter allergy medicines and Flonase provide suboptimal benefit. He is a former two - three pack per day smoker; he smoked for 20 years. He is not on ACE inhibitor.  His main concern is persistent painless gynecomastia on the left. He states that mammograms revealed no malignancy which is reassuring but he is concerned about it the persistence of the breast changes and its cause.He denies excess caffeine intake.He is not on Spironolactone.    Review of Systems  He denies fever, chills, sweats, weight loss. He has no nasal purulence or purulence sputum production. There is no frontal headache, facial pain, dental pain, or sore throat. Despite the cough and the smoking history; he is not having active wheezing or shortness of breath. Reflux is not significant     Objective:   Physical Exam Significant or distinguishing  findings on physical exam include:Tympanic membranes are scarred and dull in appearance. Minor rhonchi are noted; breath sounds are diffusely decreased. S4 present without murmur. Abdomens protuberant. Visible & palpable gynecomastia as noted on the left. There is no tenderness to palpation; no discharges noted. Gen.: Adequately nourished in appearance. Alert, appropriate and cooperative throughout exam. Appears younger than stated age  Head: Normocephalic without obvious abnormalities; pattern alopecia  Eyes: No corneal or conjunctival inflammation noted. Pupils equal round  reactive to light and accommodation. No icterus Nose: External nasal exam reveals no deformity or inflammation. Nasal mucosa are pink and moist. No lesions or exudates noted.   Mouth: Oral mucosa and oropharynx reveal no lesions or exudates. Teeth in good repair. Neck: No deformities, masses, or tenderness noted. Thyroid normal. Lungs: No increased work of breathing. Heart: Normal rate and rhythm. Normal S1 and S2. No gallop, click, or rub. Abdomen: Bowel sounds normal; abdomen soft and nontender. No masses, organomegaly or hernias noted. Genitalia: Genitalia normal except for left varices. No testicular atrophy. Musculoskeletal/extremities: No deformity or scoliosis noted of  the thoracic or lumbar spine.  No clubbing, cyanosis, edema, or significant extremity  deformity noted. Tone & strength normal. Hand joints normal Fingernail health good. Able to lie down & sit up w/o help. Negative SLR bilaterally Vascular: Carotid, radial artery, dorsalis pedis and  posterior tibial pulses are full and equal. No bruits present. Neurologic: Alert and oriented x3. Deep tendon reflexes symmetrical and normal.  Gait normal      Skin: Intact without suspicious lesions or rashes. Lymph: No cervical, axillary, or inguinal lymphadenopathy present. Psych: Mood and affect are normal. Normally interactive  Assessment & Plan:  #1cough #2  allergic rhinitis  #3 gynecomastia See orders & AVS

## 2013-05-14 NOTE — Progress Notes (Signed)
Pre visit review using our clinic review tool, if applicable. No additional management support is needed unless otherwise documented below in the visit note. 

## 2013-05-14 NOTE — Patient Instructions (Addendum)
Plain Mucinex (NOT D) for thick secretions ;force NON dairy fluids .   Nasal cleansing in the shower as discussed with lather of mild shampoo.After 10 seconds wash off lather while  exhaling through nostrils. Make sure that all residual soap is removed to prevent irritation.  Flonase OR Nasacort AQ 1 spray in each nostril twice a day as needed. Use the "crossover" technique into opposite nostril spraying toward opposite ear @ 45 degree angle, not straight up into nostril.  Use a Neti pot daily only  as needed for significant sinus congestion; going from open side to congested side . Plain Allegra (NOT D )  160 daily , Loratidine 10 mg , OR Zyrtec 10 mg @ bedtime  as needed for itchy eyes & sneezing.  Advair sample one inhalation every 12 hours; gargle and spit after use 

## 2013-05-20 ENCOUNTER — Other Ambulatory Visit (INDEPENDENT_AMBULATORY_CARE_PROVIDER_SITE_OTHER): Payer: Managed Care, Other (non HMO)

## 2013-05-20 DIAGNOSIS — E291 Testicular hypofunction: Secondary | ICD-10-CM

## 2013-05-20 DIAGNOSIS — N62 Hypertrophy of breast: Secondary | ICD-10-CM

## 2013-05-20 LAB — HEPATIC FUNCTION PANEL
ALK PHOS: 36 U/L — AB (ref 39–117)
ALT: 25 U/L (ref 0–53)
AST: 23 U/L (ref 0–37)
Albumin: 4 g/dL (ref 3.5–5.2)
BILIRUBIN DIRECT: 0.1 mg/dL (ref 0.0–0.3)
TOTAL PROTEIN: 7.2 g/dL (ref 6.0–8.3)
Total Bilirubin: 1.4 mg/dL — ABNORMAL HIGH (ref 0.2–1.2)

## 2013-05-20 LAB — LUTEINIZING HORMONE: LH: 4.21 m[IU]/mL (ref 3.10–34.60)

## 2013-05-20 LAB — TESTOSTERONE: Testosterone: 217.79 ng/dL — ABNORMAL LOW (ref 300.00–890.00)

## 2013-05-21 LAB — HCG, SERUM, QUALITATIVE: Preg, Serum: NEGATIVE

## 2013-05-23 ENCOUNTER — Other Ambulatory Visit: Payer: Self-pay | Admitting: Internal Medicine

## 2013-05-23 DIAGNOSIS — R7989 Other specified abnormal findings of blood chemistry: Secondary | ICD-10-CM | POA: Insufficient documentation

## 2013-05-28 LAB — ESTRADIOL, FREE
Estradiol, Free: 0.4 pg/mL
Estradiol: 17 pg/mL

## 2013-11-30 ENCOUNTER — Ambulatory Visit (INDEPENDENT_AMBULATORY_CARE_PROVIDER_SITE_OTHER): Payer: Managed Care, Other (non HMO) | Admitting: Internal Medicine

## 2013-11-30 ENCOUNTER — Encounter: Payer: Self-pay | Admitting: Internal Medicine

## 2013-11-30 ENCOUNTER — Other Ambulatory Visit (INDEPENDENT_AMBULATORY_CARE_PROVIDER_SITE_OTHER): Payer: Managed Care, Other (non HMO)

## 2013-11-30 VITALS — BP 132/92 | HR 104 | Temp 98.3°F | Resp 16 | Ht 69.0 in | Wt 206.1 lb

## 2013-11-30 DIAGNOSIS — R7309 Other abnormal glucose: Secondary | ICD-10-CM

## 2013-11-30 DIAGNOSIS — Z0000 Encounter for general adult medical examination without abnormal findings: Secondary | ICD-10-CM

## 2013-11-30 DIAGNOSIS — M48061 Spinal stenosis, lumbar region without neurogenic claudication: Secondary | ICD-10-CM

## 2013-11-30 DIAGNOSIS — M4806 Spinal stenosis, lumbar region: Secondary | ICD-10-CM

## 2013-11-30 DIAGNOSIS — E785 Hyperlipidemia, unspecified: Secondary | ICD-10-CM

## 2013-11-30 DIAGNOSIS — R739 Hyperglycemia, unspecified: Secondary | ICD-10-CM

## 2013-11-30 LAB — HEMOGLOBIN A1C: HEMOGLOBIN A1C: 6.4 % (ref 4.6–6.5)

## 2013-11-30 LAB — BASIC METABOLIC PANEL
BUN: 15 mg/dL (ref 6–23)
CHLORIDE: 106 meq/L (ref 96–112)
CO2: 26 mEq/L (ref 19–32)
Calcium: 9 mg/dL (ref 8.4–10.5)
Creatinine, Ser: 0.9 mg/dL (ref 0.4–1.5)
GFR: 84.47 mL/min (ref >60.00)
Glucose, Bld: 86 mg/dL (ref 70–99)
POTASSIUM: 4.4 meq/L (ref 3.5–5.1)
Sodium: 143 mEq/L (ref 135–145)

## 2013-11-30 LAB — LIPID PANEL
CHOL/HDL RATIO: 6
CHOLESTEROL: 163 mg/dL (ref 0–200)
HDL: 27.2 mg/dL — ABNORMAL LOW (ref >39.00)
NonHDL: 135.8
TRIGLYCERIDES: 318 mg/dL — AB (ref 0.0–149.0)
VLDL: 63.6 mg/dL — ABNORMAL HIGH (ref 0.0–40.0)

## 2013-11-30 NOTE — Assessment & Plan Note (Addendum)
Patient states he had shingles shot years ago. Up-to-date on all other immunizations. Likely when next colonoscopy due he will no longer be a candidate for screening. Talked to him about health, diet, exercise.

## 2013-11-30 NOTE — Progress Notes (Signed)
Pre visit review using our clinic review tool, if applicable. No additional management support is needed unless otherwise documented below in the visit note. 

## 2013-11-30 NOTE — Progress Notes (Signed)
   Subjective:    Patient ID: Belva Bertinrthur V Berisha, male    DOB: 28-Jul-1933, 80 y.o.   MRN: [id number]  HPI The patient is a 80 year old male who comes in today to establish care and for Medicare wellness. He is having some acute back pain on top of a past medical history of spinal stenosis, hyperlipidemia, large intestine enteritis. She does use a Lyrica for his spinal stenosis which does well most of the time however he was reaching for something and strained his back and thinks that this is caused him an increase in his amount of pain currently. He recently started seeing a chiropractor and is not sure if that is helping out. He does not like to take medications. Denies chest pain, shortness of breath. He does feel like he is out of shape right now as he stopped going to the gym earlier this year.  Diet: heart healthy Physical activity: sedentary Depression/mood screen: negative Hearing: intact to whispered voice Visual acuity: grossly normal, performs annual eye exam  ADLs: capable Fall risk: none Home safety: good Cognitive evaluation: intact to orientation, naming, recall and repetition EOL planning: adv directives, discussed hcpoa  I have personally reviewed and have noted 1. The patient's medical and social history 2. Their use of alcohol, tobacco or illicit drugs 3. Their current medications and supplements 4. The patient's functional ability including ADL's, fall risks, home safety risks and hearing or visual impairment. 5. Diet and physical activities 6. Evidence for depression or mood disorders 7. Care team reviewed and updated   Review of Systems  Constitutional: Negative for fever, activity change, appetite change, fatigue and unexpected weight change.  HENT: Negative.   Eyes: Negative.   Respiratory: Negative for cough, chest tightness, shortness of breath and wheezing.   Cardiovascular: Negative for chest pain, palpitations and leg swelling.  Gastrointestinal: Negative  for abdominal pain, diarrhea, constipation and abdominal distention.  Musculoskeletal: Positive for back pain and arthralgias. Negative for myalgias and gait problem.  Skin: Negative.   Neurological: Negative for dizziness, weakness, light-headedness, numbness and headaches.      Objective:   Physical Exam  Constitutional: He is oriented to person, place, and time. He appears well-developed and well-nourished.  HENT:  Head: Normocephalic and atraumatic.  Eyes: EOM are normal.  Neck: Normal range of motion.  Cardiovascular: Normal rate and regular rhythm.   Pulmonary/Chest: Effort normal and breath sounds normal. No respiratory distress. He has no wheezes. He has no rales.  Abdominal: Soft. Bowel sounds are normal. He exhibits no distension. There is no tenderness. There is no rebound.  Musculoskeletal:  Not much lumbar paraspinal tenderness.  Neurological: He is alert and oriented to person, place, and time. Coordination normal.   Filed Vitals:   11/30/13 1301  BP: 132/92  Pulse: 104  Temp: 98.3 F (36.8 C)  TempSrc: Oral  Resp: 16  Height: 5\' 9"  (1.753 m)  Weight: 206 lb 1.9 oz (93.495 kg)  SpO2: 95%      Assessment & Plan:

## 2013-11-30 NOTE — Patient Instructions (Signed)
We will check your blood work today.  You're doing well overall however it may be helpful for your back to try and get into an exercise program.  We will see back next year for your physical. If you have any more problems or questions please feel free to call our office. If you're still having problems with her back please call us and we can send in something for pain.  Back Exercises Back exercises help treat and prevent back injuries. The goal is to increase your strength in your belly (abdominal) and back muscles. These exercises can also help with flexibility. Start these exercises when told by your doctor. HOME CARE Back exercises include: Pelvic Tilt.  Lie on your back with your knees bent. Tilt your pelvis until the lower part of your back is against the floor. Hold this position 5 to 10 sec. Repeat this exercise 5 to 10 times. Knee to Chest.  Pull 1 knee up against your chest and hold for 20 to 30 seconds. Repeat this with the other knee. This may be done with the other leg straight or bent, whichever feels better. Then, pull both knees up against your chest. Sit-Ups or Curl-Ups.  Bend your knees 90 degrees. Start with tilting your pelvis, and do a partial, slow sit-up. Only lift your upper half 30 to 45 degrees off the floor. Take at least 2 to 3 seonds for each sit-up. Do not do sit-ups with your knees out straight. If partial sit-ups are difficult, simply do the above but with only tightening your belly (abdominal) muscles and holding it as told. Hip-Lift.  Lie on your back with your knees flexed 90 degrees. Push down with your feet and shoulders as you raise your hips 2 inches off the floor. Hold for 10 seconds, repeat 5 to 10 times. Back Arches.  Lie on your stomach. Prop yourself up on bent elbows. Slowly press on your hands, causing an arch in your low back. Repeat 3 to 5 times. Shoulder-Lifts.  Lie face down with arms beside your body. Keep hips and belly pressed to floor  as you slowly lift your head and shoulders off the floor. Do not overdo your exercises. Be careful in the beginning. Exercises may cause you some mild back discomfort. If the pain lasts for more than 15 minutes, stop the exercises until you see your doctor. Improvement with exercise for back problems is slow.  Document Released: 01/26/2010 Document Revised: 03/18/2011 Document Reviewed: 10/25/2010 [facility] Patient Information 2015 [place], [place]. This information is not intended to replace advice given to you by your health care provider. Make sure you discuss any questions you have with your health care provider.

## 2013-11-30 NOTE — Assessment & Plan Note (Signed)
Due to discrepancy in leg length after leg fracture remote. He is having increased pain due to her recent overactivity. Advised heat, ice. Did go over back exercises with him. He will going to see a Restaurant manager, fast food. Offered Voltaren gel however he declined.

## 2013-11-30 NOTE — Assessment & Plan Note (Signed)
Check hemoglobin A1c today.

## 2013-12-01 LAB — LDL CHOLESTEROL, DIRECT: Direct LDL: 97.2 mg/dL

## 2013-12-16 ENCOUNTER — Ambulatory Visit: Payer: Managed Care, Other (non HMO) | Admitting: Family Medicine

## 2013-12-27 ENCOUNTER — Other Ambulatory Visit: Payer: Self-pay | Admitting: Geriatric Medicine

## 2013-12-27 MED ORDER — LOVASTATIN 20 MG PO TABS: 20.0000 mg | ORAL_TABLET | Freq: Every evening | ORAL | Status: DC

## 2014-01-10 ENCOUNTER — Encounter: Payer: Self-pay | Admitting: Internal Medicine

## 2014-01-12 ENCOUNTER — Encounter: Payer: Self-pay | Admitting: Internal Medicine

## 2014-03-07 ENCOUNTER — Ambulatory Visit: Payer: Managed Care, Other (non HMO) | Admitting: Family Medicine

## 2014-05-04 ENCOUNTER — Ambulatory Visit (INDEPENDENT_AMBULATORY_CARE_PROVIDER_SITE_OTHER)
Admission: RE | Admit: 2014-05-04 | Discharge: 2014-05-04 | Disposition: A | Discharge status: Home / Self Care | Payer: Medicare HMO | Source: Ambulatory Visit | Attending: Family Medicine | Admitting: Family Medicine

## 2014-05-04 ENCOUNTER — Ambulatory Visit (INDEPENDENT_AMBULATORY_CARE_PROVIDER_SITE_OTHER): Payer: Medicare HMO | Admitting: Family Medicine

## 2014-05-04 ENCOUNTER — Encounter: Payer: Self-pay | Admitting: Family Medicine

## 2014-05-04 VITALS — BP 128/74 | HR 80 | Ht 71.0 in | Wt 200.0 lb

## 2014-05-04 DIAGNOSIS — IMO0002 Reserved for concepts with insufficient information to code with codable children: Secondary | ICD-10-CM | POA: Insufficient documentation

## 2014-05-04 DIAGNOSIS — M129 Arthropathy, unspecified: Secondary | ICD-10-CM

## 2014-05-04 DIAGNOSIS — M25561 Pain in right knee: Secondary | ICD-10-CM

## 2014-05-04 NOTE — Progress Notes (Signed)
Pre visit review using our clinic review tool, if applicable. No additional management support is needed unless otherwise documented below in the visit note. 

## 2014-05-04 NOTE — Progress Notes (Signed)
[facility] [place], [place] [address] Phone: [phone] Subjective:    I'm seeing this patient by the request  of:  Olga Millers, MD   CC: Right knee pain  RCV:[id number] Edward English is a 81 y.o. male coming in with complaint of right knee pain. Patient describes and more of a dull throbbing aching sensation. Patient does not remember any injury within the last 3 decades. Patient though did have a broken ankle in this was not fixed correctly at some point. Patient states that it is more of a pain that is starting affects some of his daily activities. More of a dull pain on the medial aspect of the knee. Patient denies any clicking, popping or giving out on him. Patient states going up and down stairs can be difficult. Patient has been seen by other providers previously and patient was given a steroid injection greater than 2 months ago that did not make any significant improvement by someone else. Patient rates the severity of pain a 6 out of 10. Denies any swelling, any radiation of the leg or any nighttime awakening.  Past Medical History  Diagnosis Date  . BENIGN PROSTATIC HYPERTROPHY, HX OF 02/17/2007    Qualifier: Diagnosis of  By: [place], [place]    . DIVERTICULITIS-COLON 02/04/2008    Qualifier: Diagnosis of  By: Sharlett Iles MD Byrd Hesselbach ERECTILE DYSFUNCTION, MILD 02/17/2007    Qualifier: Diagnosis of  By: [place], [place]    . HYPERGLYCEMIA 02/17/2007    Qualifier: Diagnosis of  By: Danny Lawless CMA, [place]    . HYPERLIPIDEMIA 02/17/2007    Qualifier: Diagnosis of  By: Danny Lawless CMA, [place]    . HYPOGONADISM 02/17/2007    Qualifier: Diagnosis of  By: Danny Lawless CMA, [place]    . OVERACTIVE BLADDER 02/17/2007    Qualifier: Diagnosis of  By: Danny Lawless CMA, [place]    . PERIPHERAL NEUROPATHY 02/17/2007    Qualifier: Diagnosis of  By: [place], [place]    . PERIPHERAL VASCULAR DISEASE 02/17/2007    Qualifier: Diagnosis of  By:  [place], [place]    . Regional enteritis of large intestine 06/23/2008    Qualifier: Diagnosis of  By: Sharlett Iles MD FACG, [place], HX OF 12/26/2009    Qualifier: Diagnosis of  By: [place] CMA, [place]    . TONSILLECTOMY, HX OF 02/17/2007    Qualifier: Diagnosis of  By: [place], [place]     Past Surgical History  Procedure Laterality Date  . Cervical hemilaminectomy      C5-6, C6-7  . Shoulder arthroscopy    . Septoplasty    . Dupuytrens contracture relief    . Tonsillectomy    . Ectropion correction  2010    Left lower eye lid   History  Substance Use Topics  . Smoking status: Former Research scientist (life sciences)  . Smokeless tobacco: Never Used  . Alcohol Use: Yes   No Known Allergies Family History  Problem Relation Age of Onset  . Diabetes Mother   . Alzheimer's disease Father   . Colon cancer Neg Hx   . Prostate cancer Neg Hx   . Stroke Neg Hx   . Coronary artery disease Neg Hx         Past medical history, social, surgical and family history all reviewed in electronic medical record.   Review of Systems: No headache, visual changes, nausea, vomiting, diarrhea, constipation, dizziness, abdominal pain,  skin rash, fevers, chills, night sweats, weight loss, swollen lymph nodes, body aches, joint swelling, muscle aches, chest pain, shortness of breath, mood changes.   Objective Blood pressure 128/74, pulse 80, height 5' 11"  (1.803 m), weight 200 lb (90.719 kg), SpO2 92 %.  General: No apparent distress alert and oriented x3 mood and affect normal, dressed appropriately.  HEENT: Pupils equal, extraocular movements intact  Respiratory: Patient's speak in full sentences and does not appear short of breath  Cardiovascular: No lower extremity edema, non tender, no erythema  Skin: Warm dry intact with no signs of infection or rash on extremities or on axial skeleton.  Abdomen: Soft nontender  Neuro: Cranial nerves II through XII are intact, neurovascularly intact in all  extremities with 2+ DTRs and 2+ pulses.  Lymph: No lymphadenopathy of posterior or anterior cervical chain or axillae bilaterally.  Gait normal with good balance and coordination.  MSK:  Non tender with full range of motion and good stability and symmetric strength and tone of shoulders, elbows, wrist, hip, and ankles bilaterally.  Knee: Right Normal to inspection with no erythema or effusion or obvious bony abnormalities. Tender to palpation over medial and lateral joint. ROM full in flexion and extension and lower leg rotation. Ligaments with solid consistent endpoints including ACL, PCL, LCL, MCL. Negative Mcmurray's, Apley's, and Thessalonian tests. Non painful patellar compression. Patellar glide with minimal crepitus. Patellar and quadriceps tendons unremarkable. Hamstring and quadriceps strength is normal.  Contralateral knee unremarkable   Procedure note 25749; 15 minutes spent for Therapeutic exercises as stated in above notes.  This included exercises focusing on stretching, strengthening, with significant focus on eccentric aspects.  Discussed vastus medialis obliques strengthening as well as hip abductor strengthening. Patient also given range of motion exercises for the ankle secondary to patient's injury previously. Proper technique shown and discussed handout in great detail with ATC.  All questions were discussed and answered.     Impression and Recommendations:     This case required medical decision making of moderate complexity.

## 2014-05-04 NOTE — Patient Instructions (Signed)
Good to meet you Ice 20 minutes 2 times daily. Usually after activity and before bed. Exercises 3 times a week.  Try the brace Take tylenol 500 mg three times a day is the best evidence based medicine we have for arthritis.  Glucosamine sulfate 1565m a day is a supplement that has been shown to help moderate to severe arthritis. Vitamin D 2000 IU daily Fish oil 2 grams daily.  Tumeric 5073mtwice daily.  Capsaicin topically up to four times a day may also help with pain. Cortisone injections are an option if these interventions do not seem to make a difference or need more relief.  If cortisone injections do not help, there are different types of shots that may help but they take longer to take effect.  We can discuss this at follow up.  It's important that you continue to stay active. Controlling your weight is important.  Consider physical therapy to strengthen muscles around the joint that hurts to take pressure off of the joint itself.  Water aerobics and cycling with low resistance are the best two types of exercise for arthritis. Come back and see me in 3 weeks.

## 2014-05-04 NOTE — Assessment & Plan Note (Signed)
Patient overall is doing relatively well. We get repeat x-rays to further evaluate patient's amount of arthritis. I am expecting patient having mild to moderate arthritis. We discussed over-the-counter natural supplementations home exercises and icing protocol. Patient did meet with athletic trainer today to for a medial unloader brace. Patient is going to try to make these different changes and come back and see me again in 3 weeks. At that point patient will be greater than 3 months out from his last steroid injection will consider this versus the possibility of viscous supplementation. Patient did decline formal physical therapy.

## 2014-05-05 LAB — HM DIABETES EYE EXAM

## 2014-05-19 ENCOUNTER — Ambulatory Visit: Payer: Managed Care, Other (non HMO) | Admitting: Family Medicine

## 2014-05-25 ENCOUNTER — Encounter: Payer: Self-pay | Admitting: Family Medicine

## 2014-05-25 ENCOUNTER — Ambulatory Visit (INDEPENDENT_AMBULATORY_CARE_PROVIDER_SITE_OTHER): Payer: Medicare HMO | Admitting: Family Medicine

## 2014-05-25 VITALS — BP 116/70 | HR 96 | Wt 203.0 lb

## 2014-05-25 DIAGNOSIS — M129 Arthropathy, unspecified: Secondary | ICD-10-CM

## 2014-05-25 DIAGNOSIS — IMO0002 Reserved for concepts with insufficient information to code with codable children: Secondary | ICD-10-CM

## 2014-05-25 NOTE — Progress Notes (Signed)
Pre visit review using our clinic review tool, if applicable. No additional management support is needed unless otherwise documented below in the visit note. 

## 2014-05-25 NOTE — Progress Notes (Signed)
Tawana ScaleZach Jannatul Wojdyla D.O. [facility] [address] [place], [place] [address] Phone: [phone] Subjective:    I'm seeing this patient by the request  of:  Judie BonusKollar, Elizabeth A, MD   CC: Right knee pain  GLO:[id number]:Subjective Edward English is a 81 y.o. male coming in with complaint of right knee pain. Patient was diagnosed with moderate osteophytic changes led last exam. Patient has been wearing the medial unloader brace, home exercises as well as staying very active. Patient states that he is approximately 90% better. Patient states that his only having a pain 2 out of 10 on a regular aspect. Nothing that his stopping him from any activities. Patient is not taking any anti-inflammatories on a regular basis. Patient did not get any of the natural vitamins. States though that he is happy with the results.  Past Medical History  Diagnosis Date  . BENIGN PROSTATIC HYPERTROPHY, HX OF 02/17/2007    Qualifier: Diagnosis of  By: Genelle GatherShoffner CMA, [place]    . DIVERTICULITIS-COLON 02/04/2008    Qualifier: Diagnosis of  By: Jarold MottoPatterson MD Lang SnowFACG, David R   . ERECTILE DYSFUNCTION, MILD 02/17/2007    Qualifier: Diagnosis of  By: Genelle GatherShoffner CMA, [place]    . HYPERGLYCEMIA 02/17/2007    Qualifier: Diagnosis of  By: Genelle GatherShoffner CMA, [place]    . HYPERLIPIDEMIA 02/17/2007    Qualifier: Diagnosis of  By: Genelle GatherShoffner CMA, [place]    . HYPOGONADISM 02/17/2007    Qualifier: Diagnosis of  By: Genelle GatherShoffner CMA, [place]    . OVERACTIVE BLADDER 02/17/2007    Qualifier: Diagnosis of  By: Genelle GatherShoffner CMA, [place]    . PERIPHERAL NEUROPATHY 02/17/2007    Qualifier: Diagnosis of  By: Genelle GatherShoffner CMA, [place]    . PERIPHERAL VASCULAR DISEASE 02/17/2007    Qualifier: Diagnosis of  By: Genelle GatherShoffner CMA, [place]    . Regional enteritis of large intestine 06/23/2008    Qualifier: Diagnosis of  By: Jarold MottoPatterson MD Lang SnowFACG, David R   . TOBACCO ABUSE, HX OF 12/26/2009    Qualifier: Diagnosis of  By: [place] CMA, [place]    . TONSILLECTOMY, HX OF 02/17/2007    Qualifier:  Diagnosis of  By: Genelle GatherShoffner CMA, [place]     Past Surgical History  Procedure Laterality Date  . Cervical hemilaminectomy      C5-6, C6-7  . Shoulder arthroscopy    . Septoplasty    . Dupuytrens contracture relief    . Tonsillectomy    . Ectropion correction  2010    Left lower eye lid   History  Substance Use Topics  . Smoking status: Former Games developermoker  . Smokeless tobacco: Never Used  . Alcohol Use: Yes   No Known Allergies Family History  Problem Relation Age of Onset  . Diabetes Mother   . Alzheimer's disease Father   . Colon cancer Neg Hx   . Prostate cancer Neg Hx   . Stroke Neg Hx   . Coronary artery disease Neg Hx         Past medical history, social, surgical and family history all reviewed in electronic medical record.   Review of Systems: No headache, visual changes, nausea, vomiting, diarrhea, constipation, dizziness, abdominal pain, skin rash, fevers, chills, night sweats, weight loss, swollen lymph nodes, body aches, joint swelling, muscle aches, chest pain, shortness of breath, mood changes.   Objective Blood pressure 116/70, pulse 96, weight 203 lb (92.08 kg), SpO2 93 %.  General: No apparent distress alert and oriented x3 mood and affect normal, dressed  appropriately.  HEENT: Pupils equal, extraocular movements intact  Respiratory: Patient's speak in full sentences and does not appear short of breath  Cardiovascular: No lower extremity edema, non tender, no erythema  Skin: Warm dry intact with no signs of infection or rash on extremities or on axial skeleton.  Abdomen: Soft nontender  Neuro: Cranial nerves II through XII are intact, neurovascularly intact in all extremities with 2+ DTRs and 2+ pulses.  Lymph: No lymphadenopathy of posterior or anterior cervical chain or axillae bilaterally.  Gait normal with good balance and coordination.  MSK:  Non tender with full range of motion and good stability and symmetric strength and tone of shoulders, elbows,  wrist, hip, and ankles bilaterally.  Knee: Right Normal to inspection with no erythema or effusion or obvious bony abnormalities. Tender to palpation over medial and lateral joint. Less than previous exam ROM full in flexion and extension and lower leg rotation. Ligaments with solid consistent endpoints including ACL, PCL, LCL, MCL. Negative Mcmurray's, Apley's, and Thessalonian tests. Non painful patellar compression. Patellar glide with minimal crepitus. Patellar and quadriceps tendons unremarkable. Hamstring and quadriceps strength is normal.  Contralateral knee unremarkable     Impression and Recommendations:     This case required medical decision making of moderate complexity.

## 2014-05-25 NOTE — Patient Instructions (Signed)
Good to see you You are doing great Continue the brace with a lot of activity Ice after activity Try the pennsaid up to 2 times daily See me again in 4 weeks if pain worsens.

## 2014-05-25 NOTE — Assessment & Plan Note (Signed)
Patient overall is doing relatively well. We discussed icing regimen and home exercises. We discussed continuing conservative therapy and we can always do injections if patient has any exacerbation. Patient will continue the bracing. Patient will follow-up with me in 4 weeks if the pain seems to worsen otherwise patient will follow-up on an as-needed basis.

## 2014-06-09 ENCOUNTER — Ambulatory Visit (INDEPENDENT_AMBULATORY_CARE_PROVIDER_SITE_OTHER)
Admission: RE | Admit: 2014-06-09 | Discharge: 2014-06-09 | Disposition: A | Discharge status: Home / Self Care | Payer: Medicare HMO | Source: Ambulatory Visit | Attending: Internal Medicine | Admitting: Internal Medicine

## 2014-06-09 ENCOUNTER — Other Ambulatory Visit (INDEPENDENT_AMBULATORY_CARE_PROVIDER_SITE_OTHER): Payer: Medicare HMO

## 2014-06-09 ENCOUNTER — Encounter: Payer: Self-pay | Admitting: Internal Medicine

## 2014-06-09 ENCOUNTER — Ambulatory Visit (INDEPENDENT_AMBULATORY_CARE_PROVIDER_SITE_OTHER): Payer: Medicare HMO | Admitting: Internal Medicine

## 2014-06-09 ENCOUNTER — Other Ambulatory Visit: Payer: Self-pay | Admitting: Internal Medicine

## 2014-06-09 VITALS — BP 138/88 | HR 79 | Temp 98.2°F | Resp 16 | Wt 200.2 lb

## 2014-06-09 DIAGNOSIS — R109 Unspecified abdominal pain: Secondary | ICD-10-CM

## 2014-06-09 DIAGNOSIS — R1013 Epigastric pain: Secondary | ICD-10-CM

## 2014-06-09 DIAGNOSIS — K5010 Crohn's disease of large intestine without complications: Secondary | ICD-10-CM

## 2014-06-09 LAB — CBC WITH DIFFERENTIAL/PLATELET
Basophils Absolute: 0.1 10*3/uL (ref 0.0–0.1)
Basophils Relative: 0.9 % (ref 0.0–3.0)
EOS ABS: 0.1 10*3/uL (ref 0.0–0.7)
Eosinophils Relative: 2.1 % (ref 0.0–5.0)
HCT: 43.4 % (ref 39.0–52.0)
Hemoglobin: 14.6 g/dL (ref 13.0–17.0)
Lymphocytes Relative: 22.7 % (ref 12.0–46.0)
Lymphs Abs: 1.4 10*3/uL (ref 0.7–4.0)
MCHC: 33.6 g/dL (ref 30.0–36.0)
MCV: 96 fl (ref 78.0–100.0)
MONOS PCT: 12.8 % — AB (ref 3.0–12.0)
Monocytes Absolute: 0.8 10*3/uL (ref 0.1–1.0)
NEUTROS PCT: 61.5 % (ref 43.0–77.0)
Neutro Abs: 3.8 10*3/uL (ref 1.4–7.7)
PLATELETS: 243 10*3/uL (ref 150.0–400.0)
RBC: 4.52 Mil/uL (ref 4.22–5.81)
RDW: 15.1 % (ref 11.5–15.5)
WBC: 6.2 10*3/uL (ref 4.0–10.5)

## 2014-06-09 LAB — BASIC METABOLIC PANEL
BUN: 16 mg/dL (ref 6–23)
CO2: 29 mEq/L (ref 19–32)
Calcium: 9 mg/dL (ref 8.4–10.5)
Chloride: 105 mEq/L (ref 96–112)
Creatinine, Ser: 0.82 mg/dL (ref 0.40–1.50)
GFR: 96.34 mL/min (ref >60.00)
Glucose, Bld: 102 mg/dL — ABNORMAL HIGH (ref 70–99)
POTASSIUM: 4.1 meq/L (ref 3.5–5.1)
Sodium: 138 mEq/L (ref 135–145)

## 2014-06-09 LAB — HEPATIC FUNCTION PANEL
ALT: 27 U/L (ref 0–53)
AST: 17 U/L (ref 0–37)
Albumin: 4.2 g/dL (ref 3.5–5.2)
Alkaline Phosphatase: 44 U/L (ref 39–117)
Bilirubin, Direct: 0.1 mg/dL (ref 0.0–0.3)
TOTAL PROTEIN: 7.2 g/dL (ref 6.0–8.3)
Total Bilirubin: 1 mg/dL (ref 0.2–1.2)

## 2014-06-09 LAB — AMYLASE: AMYLASE: 27 U/L (ref 27–131)

## 2014-06-09 LAB — LIPASE: Lipase: 17 U/L (ref 11.0–59.0)

## 2014-06-09 NOTE — Progress Notes (Signed)
   Subjective:    Patient ID: Edward English, male    DOB: 1933/10/02, 81 y.o.   MRN: [id number]  HPI He describes epigastric discomfort which radiates laterally to both upper quadrants intermittently over the last 3 weeks. It is described as sharp and aggravated by position change such as standing or walking. Turning while walking or flexing at the waist aggravate it. Deep breathing can impact it also but less so. It lasts less than 5 seconds,only as long as that position change is maintained.  He has a history of colitis. His generic Asac[facility]. With this change he's had some increased constipation.  He has peripheral neuropathy which is manifested as numbness or tingling and shooting pains in the great toes.  He denies any other cardiopulmonary, GI, or neuromuscular symptoms.    Review of Systems Unexplained weight loss, abdominal pain, significant dyspepsia, dysphagia, melena, rectal bleeding, or persistently small caliber stools are denied. Chest pain, palpitations, tachycardia, exertional dyspnea, paroxysmal nocturnal dyspnea, claudication or edema are absent. Fever, chills, sweats, or unexplained weight loss not present. No loss of control of bladder or bowels. Dysuria, pyuria, hematuria, frequency, nocturia or polyuria are denied.       Objective:   Physical Exam Pertinent or positive findings include: Pattern alopecia is present.  He has bilateral ptosis.  Breath sounds are decreased.  Gynecomastia suggested  Abdomen is protuberant with a ventral hernia.  He has a plethora of his hands which improves when supine. He has Dupuytren's  contractures greater on the left than the right with flexion contracture of the fifth left finger.  Gait is slightly broad and slightly unsteady.  General appearance :adequately nourished; in no distress. Eyes: No conjunctival inflammation or scleral icterus is present. Oral exam:  Lips and gums are healthy  appearing.There is no oropharyngeal erythema or exudate noted. Dental hygiene is good. Heart:  Normal rate and regular rhythm. S1 and S2 normal without gallop, murmur, click, rub or other extra sounds   Lungs:Chest clear to auscultation; no wheezes, rhonchi,rales ,or rubs present.No increased work of breathing.  Abdomen: bowel sounds normal, soft and non-tender without masses, or organomegaly noted.  No guarding or rebound. No flank tenderness to percussion. Vascular : all pulses equal ; no bruits present. Skin:Warm & dry.  Intact without suspicious lesions or rashes ; no tenting or jaundice  Lymphatic: No lymphadenopathy is noted about the head, neck, axilla Neuro: Strength, tone normal.        Assessment & Plan:  #1 epigastric and bilateral upper quadrant radiating pain. The most likely etiology would be a thoracic radiculopathy. Hiatal hernia must be considered  #2 history of colitis, improved  Plan: See orders/ recommendations

## 2014-06-09 NOTE — Progress Notes (Signed)
Pre visit review using our clinic review tool, if applicable. No additional management support is needed unless otherwise documented below in the visit note. 

## 2014-06-09 NOTE — Patient Instructions (Signed)
  Your next office appointment will be determined based upon review of your pending labs  and  xrays  Those written interpretation of the lab results and instructions will be transmitted to you by My Chart   Critical results will be called.   Followup as needed for any active or acute issue. Please report any significant change in your symptoms.

## 2014-06-10 ENCOUNTER — Other Ambulatory Visit (INDEPENDENT_AMBULATORY_CARE_PROVIDER_SITE_OTHER): Payer: Medicare HMO

## 2014-06-10 DIAGNOSIS — R739 Hyperglycemia, unspecified: Secondary | ICD-10-CM

## 2014-06-10 LAB — HEMOGLOBIN A1C: HEMOGLOBIN A1C: 5.9 % (ref 4.6–6.5)

## 2014-06-12 ENCOUNTER — Encounter: Payer: Self-pay | Admitting: Internal Medicine

## 2014-06-16 ENCOUNTER — Encounter: Payer: Self-pay | Admitting: Internal Medicine

## 2014-06-21 DIAGNOSIS — Z87438 Personal history of other diseases of male genital organs: Secondary | ICD-10-CM | POA: Insufficient documentation

## 2014-06-30 ENCOUNTER — Ambulatory Visit: Payer: Medicare HMO | Admitting: Family Medicine

## 2014-07-12 ENCOUNTER — Encounter: Payer: Self-pay | Admitting: Gastroenterology

## 2014-07-15 ENCOUNTER — Other Ambulatory Visit: Payer: Self-pay | Admitting: Orthopedic Surgery

## 2014-07-15 DIAGNOSIS — M545 Low back pain: Secondary | ICD-10-CM

## 2014-07-26 ENCOUNTER — Telehealth: Payer: Self-pay | Admitting: Internal Medicine

## 2014-07-26 NOTE — Telephone Encounter (Signed)
Patient stated he got into a wasp nest and need some medication prednisone, he is swelling up, and feeling really bad, I suggested that he go to the er.please advise

## 2014-07-26 NOTE — Telephone Encounter (Signed)
I spoke with patient. I offered to see if we could get him in today, but he said he would just go to an urgent care.

## 2014-07-31 ENCOUNTER — Ambulatory Visit
Admission: RE | Admit: 2014-07-31 | Discharge: 2014-07-31 | Disposition: A | Discharge status: Home / Self Care | Payer: Non-veteran care | Source: Ambulatory Visit | Attending: Orthopedic Surgery | Admitting: Orthopedic Surgery

## 2014-07-31 DIAGNOSIS — M545 Low back pain: Secondary | ICD-10-CM

## 2014-10-10 ENCOUNTER — Other Ambulatory Visit (INDEPENDENT_AMBULATORY_CARE_PROVIDER_SITE_OTHER): Payer: Medicare HMO

## 2014-10-10 ENCOUNTER — Encounter: Payer: Self-pay | Admitting: Internal Medicine

## 2014-10-10 ENCOUNTER — Ambulatory Visit (INDEPENDENT_AMBULATORY_CARE_PROVIDER_SITE_OTHER): Payer: Medicare HMO | Admitting: Internal Medicine

## 2014-10-10 VITALS — BP 136/82 | HR 77 | Temp 97.6°F | Resp 12 | Ht 69.0 in | Wt 197.1 lb

## 2014-10-10 DIAGNOSIS — E785 Hyperlipidemia, unspecified: Secondary | ICD-10-CM

## 2014-10-10 DIAGNOSIS — Z0000 Encounter for general adult medical examination without abnormal findings: Secondary | ICD-10-CM | POA: Diagnosis not present

## 2014-10-10 DIAGNOSIS — Z87891 Personal history of nicotine dependence: Secondary | ICD-10-CM

## 2014-10-10 LAB — LIPID PANEL
CHOLESTEROL: 219 mg/dL — AB (ref 0–200)
HDL: 29.1 mg/dL — ABNORMAL LOW (ref >39.00)
LDL CALC: 150 mg/dL — AB (ref 0–99)
NONHDL: 190.1
Total CHOL/HDL Ratio: 8
Triglycerides: 200 mg/dL — ABNORMAL HIGH (ref 0.0–149.0)
VLDL: 40 mg/dL (ref 0.0–40.0)

## 2014-10-10 LAB — COMPREHENSIVE METABOLIC PANEL
ALBUMIN: 4.2 g/dL (ref 3.5–5.2)
ALT: 44 U/L (ref 0–53)
AST: 26 U/L (ref 0–37)
Alkaline Phosphatase: 41 U/L (ref 39–117)
BUN: 17 mg/dL (ref 6–23)
CHLORIDE: 105 meq/L (ref 96–112)
CO2: 27 mEq/L (ref 19–32)
CREATININE: 0.9 mg/dL (ref 0.40–1.50)
Calcium: 9.1 mg/dL (ref 8.4–10.5)
GFR: 86.45 mL/min (ref >60.00)
GLUCOSE: 103 mg/dL — AB (ref 70–99)
POTASSIUM: 4.4 meq/L (ref 3.5–5.1)
SODIUM: 142 meq/L (ref 135–145)
TOTAL PROTEIN: 7.4 g/dL (ref 6.0–8.3)
Total Bilirubin: 1.3 mg/dL — ABNORMAL HIGH (ref 0.2–1.2)

## 2014-10-10 NOTE — Assessment & Plan Note (Signed)
Immunizations up to date except flu which he will get in 1-2 weeks. Does exercise regularly. Non-smoker. Counseled him about skin cancer prevention and protective clothing.

## 2014-10-10 NOTE — Assessment & Plan Note (Signed)
Still non-smoker for many years, counseled him on staying away from cigarettes and he will.

## 2014-10-10 NOTE — Patient Instructions (Signed)
We are going to check the blood work today and call you back with the results.  Remember to get the flu shot later this month.   Health Maintenance A healthy lifestyle and preventative care can promote health and wellness.  Maintain regular health, dental, and eye exams.  Eat a healthy diet. Foods like vegetables, fruits, whole grains, low-fat dairy products, and lean protein foods contain the nutrients you need and are low in calories. Decrease your intake of foods high in solid fats, added sugars, and salt. Get information about a proper diet from your health care provider, if necessary.  Regular physical exercise is one of the most important things you can do for your health. Most adults should get at least 150 minutes of moderate-intensity exercise (any activity that increases your heart rate and causes you to sweat) each week. In addition, most adults need muscle-strengthening exercises on 2 or more days a week.   Maintain a healthy weight. The body mass index (BMI) is a screening tool to identify possible weight problems. It provides an estimate of body fat based on height and weight. Your health care provider can find your BMI and can help you achieve or maintain a healthy weight. For males 20 years and older:  A BMI below 18.5 is considered underweight.  A BMI of 18.5 to 24.9 is normal.  A BMI of 25 to 29.9 is considered overweight.  A BMI of 30 and above is considered obese.  Maintain normal blood lipids and cholesterol by exercising and minimizing your intake of saturated fat. Eat a balanced diet with plenty of fruits and vegetables. Blood tests for lipids and cholesterol should begin at age 50 and be repeated every 5 years. If your lipid or cholesterol levels are high, you are over age 52, or you are at high risk for heart disease, you may need your cholesterol levels checked more frequently.Ongoing high lipid and cholesterol levels should be treated with medicines if diet and  exercise are not working.  If you smoke, find out from your health care provider how to quit. If you do not use tobacco, do not start.  Lung cancer screening is recommended for adults aged 55-80 years who are at high risk for developing lung cancer because of a history of smoking. A yearly low-dose CT scan of the lungs is recommended for people who have at least a 30-pack-year history of smoking and are current smokers or have quit within the past 15 years. A pack year of smoking is smoking an average of 1 pack of cigarettes a day for 1 year (for example, a 30-pack-year history of smoking could mean smoking 1 pack a day for 30 years or 2 packs a day for 15 years). Yearly screening should continue until the smoker has stopped smoking for at least 15 years. Yearly screening should be stopped for people who develop a health problem that would prevent them from having lung cancer treatment.  If you choose to drink alcohol, do not have more than 2 drinks per day. One drink is considered to be 12 oz (360 mL) of beer, 5 oz (150 mL) of wine, or 1.5 oz (45 mL) of liquor.  Avoid the use of street drugs. Do not share needles with anyone. Ask for help if you need support or instructions about stopping the use of drugs.  High blood pressure causes heart disease and increases the risk of stroke. Blood pressure should be checked at least every 1-2 years. Ongoing high  blood pressure should be treated with medicines if weight loss and exercise are not effective.  If you are 67-21 years old, ask your health care provider if you should take aspirin to prevent heart disease.  Diabetes screening involves taking a blood sample to check your fasting blood sugar level. This should be done once every 3 years after age 9 if you are at a normal weight and without risk factors for diabetes. Testing should be considered at a younger age or be carried out more frequently if you are overweight and have at least 1 risk factor for  diabetes.  Colorectal cancer can be detected and often prevented. Most routine colorectal cancer screening begins at the age of 78 and continues through age 38. However, your health care provider may recommend screening at an earlier age if you have risk factors for colon cancer. On a yearly basis, your health care provider may provide home test kits to check for hidden blood in the stool. A small camera at the end of a tube may be used to directly examine the colon (sigmoidoscopy or colonoscopy) to detect the earliest forms of colorectal cancer. Talk to your health care provider about this at age 4 when routine screening begins. A direct exam of the colon should be repeated every 5-10 years through age 18, unless early forms of precancerous polyps or small growths are found.  People who are at an increased risk for hepatitis B should be screened for this virus. You are considered at high risk for hepatitis B if:  You were born in a country where hepatitis B occurs often. Talk with your health care provider about which countries are considered high risk.  Your parents were born in a high-risk country and you have not received a shot to protect against hepatitis B (hepatitis B vaccine).  You have HIV or AIDS.  You use needles to inject street drugs.  You live with, or have sex with, someone who has hepatitis B.  You are a man who has sex with other men (MSM).  You get hemodialysis treatment.  You take certain medicines for conditions like cancer, organ transplantation, and autoimmune conditions.  Hepatitis C blood testing is recommended for all people born from [birth year] through 1963 and any individual with known risk factors for hepatitis C.  Healthy men should no longer receive prostate-specific antigen (PSA) blood tests as part of routine cancer screening. Talk to your health care provider about prostate cancer screening.  Testicular cancer screening is not recommended for adolescents or  adult males who have no symptoms. Screening includes self-exam, a health care provider exam, and other screening tests. Consult with your health care provider about any symptoms you have or any concerns you have about testicular cancer.  Practice safe sex. Use condoms and avoid high-risk sexual practices to reduce the spread of sexually transmitted infections (STIs).  You should be screened for STIs, including gonorrhea and chlamydia if:  You are sexually active and are younger than 24 years.  You are older than 24 years, and your health care provider tells you that you are at risk for this type of infection.  Your sexual activity has changed since you were last screened, and you are at an increased risk for chlamydia or gonorrhea. Ask your health care provider if you are at risk.  If you are at risk of being infected with HIV, it is recommended that you take a prescription medicine daily to prevent HIV infection. This is called  pre-exposure prophylaxis (PrEP). You are considered at risk if:  You are a man who has sex with other men (MSM).  You are a heterosexual man who is sexually active with multiple partners.  You take drugs by injection.  You are sexually active with a partner who has HIV.  Talk with your health care provider about whether you are at high risk of being infected with HIV. If you choose to begin PrEP, you should first be tested for HIV. You should then be tested every 3 months for as long as you are taking PrEP.  Use sunscreen. Apply sunscreen liberally and repeatedly throughout the day. You should seek shade when your shadow is shorter than you. Protect yourself by wearing long sleeves, pants, a wide-brimmed hat, and sunglasses year round whenever you are outdoors.  Tell your health care provider of new moles or changes in moles, especially if there is a change in shape or color. Also, tell your health care provider if a mole is larger than the size of a pencil  eraser.  A one-time screening for abdominal aortic aneurysm (AAA) and surgical repair of large AAAs by ultrasound is recommended for men aged 12-75 years who are current or former smokers.  Stay current with your vaccines (immunizations). Document Released: 06/22/2007 Document Revised: 12/29/2012 Document Reviewed: 05/21/2010 [facility] Patient Information 2015 [place], [place]. This information is not intended to replace advice given to you by your health care provider. Make sure you discuss any questions you have with your health care provider.

## 2014-10-10 NOTE — Progress Notes (Signed)
Pre visit review using our clinic review tool, if applicable. No additional management support is needed unless otherwise documented below in the visit note. 

## 2014-10-10 NOTE — Progress Notes (Signed)
   Subjective:    Patient ID: Edward English, male    DOB: 1933-08-27, 81 y.o.   MRN: [id number]  HPI Here for medicare wellness, no new complaints. Please see A/P for status and treatment of chronic medical problems.   Diet: heart healthy Physical activity: sedentary Depression/mood screen: negative Hearing: intact to whispered voice Visual acuity: grossly normal, performs annual eye exam  ADLs: capable Fall risk: none Home safety: good Cognitive evaluation: intact to orientation, naming, recall and repetition EOL planning: adv directives discussed  I have personally reviewed and have noted 1. The patient's medical and social history - reviewed today no changes 2. Their use of alcohol, tobacco or illicit drugs 3. Their current medications and supplements 4. The patient's functional ability including ADL's, fall risks, home safety risks and hearing or visual impairment. 5. Diet and physical activities 6. Evidence for depression or mood disorders 7. Care team reviewed and updated (available in snapshot)  Review of Systems  Constitutional: Negative for fever, activity change, appetite change, fatigue and unexpected weight change.  HENT: Negative.   Eyes: Negative.   Respiratory: Negative for cough, chest tightness, shortness of breath and wheezing.   Cardiovascular: Negative for chest pain, palpitations and leg swelling.  Gastrointestinal: Negative for abdominal pain, diarrhea, constipation and abdominal distention.  Musculoskeletal: Positive for back pain and arthralgias. Negative for myalgias and gait problem.  Skin: Negative.   Neurological: Negative for dizziness, weakness, light-headedness, numbness and headaches.  Psychiatric/Behavioral: Negative.       Objective:   Physical Exam  Constitutional: He is oriented to person, place, and time. He appears well-developed and well-nourished.  HENT:  Head: Normocephalic and atraumatic.  Eyes: EOM are normal.  Neck: Normal  range of motion.  Cardiovascular: Normal rate and regular rhythm.   Pulmonary/Chest: Effort normal and breath sounds normal. No respiratory distress. He has no wheezes. He has no rales.  Abdominal: Soft. Bowel sounds are normal. He exhibits no distension. There is no tenderness. There is no rebound.  Musculoskeletal:  Not much lumbar paraspinal tenderness.  Neurological: He is alert and oriented to person, place, and time. Coordination normal.  Skin: Skin is warm and dry.  Psychiatric: He has a normal mood and affect.   Filed Vitals:   10/10/14 0826  BP: 136/82  Pulse: 77  Temp: 97.6 F (36.4 C)  TempSrc: Oral  Resp: 12  Height: 5' 9"  (1.753 m)  Weight: 197 lb 1.9 oz (89.413 kg)  SpO2: 96%      Assessment & Plan:

## 2014-10-10 NOTE — Assessment & Plan Note (Signed)
Previously on medicine. Checking lipid panel today and adjust as needed.

## 2014-11-01 ENCOUNTER — Ambulatory Visit: Payer: Non-veteran care | Admitting: Diagnostic Neuroimaging

## 2014-11-10 ENCOUNTER — Encounter: Payer: Self-pay | Admitting: Internal Medicine

## 2014-11-23 ENCOUNTER — Encounter: Payer: Self-pay | Admitting: Diagnostic Neuroimaging

## 2014-11-23 ENCOUNTER — Ambulatory Visit (INDEPENDENT_AMBULATORY_CARE_PROVIDER_SITE_OTHER): Payer: Medicare HMO | Admitting: Diagnostic Neuroimaging

## 2014-11-23 VITALS — BP 136/74 | HR 102 | Ht 69.0 in | Wt 198.4 lb

## 2014-11-23 DIAGNOSIS — G609 Hereditary and idiopathic neuropathy, unspecified: Secondary | ICD-10-CM

## 2014-11-23 NOTE — Progress Notes (Signed)
[facility]  PATIENT: Edward English DOB: [birth year]  REFERRING CLINICIAN: C Mulles  HISTORY FROM: patient  REASON FOR VISIT: new consult     HISTORICAL  CHIEF COMPLAINT:  Chief Complaint  Patient presents with  . Peripheral neuropathy    rm 7, New Patient    HISTORY OF PRESENT ILLNESS:   81 year old male here for evaluation of idiopathic neuropathy. Patient was diagnosed in 2000 after onset of numbness, tingling, burning in toes and feet. Symptoms have slowly and gradually increased over time. He is tried gabapentin, amitriptyline, Lyrica without relief. Currently he is not any neuropathy medications. He does not have diabetes.  Symptoms have gradually progressed but not progressed above his ankles. No problems in his calves are upper legs. No problems with his fingers or hands. He does have chronic low back pain. He had MRI of the lumbar spine recently.   REVIEW OF SYSTEMS: Full 14 system review of systems performed and notable only for fatigue double vision shortness of breath hearing loss ringing in ears itching joint pain runny nose decreased energy headache numbness restless legs shortness of breath double vision.  ALLERGIES: No Known Allergies  HOME MEDICATIONS: Outpatient Prescriptions Prior to Visit  Medication Sig Dispense Refill  . tolterodine (DETROL) 2 MG tablet Take 2 mg by mouth 2 (two) times daily.    Marland Kitchen acetaminophen (TYLENOL) 500 MG tablet Take 500 mg by mouth every 6 (six) hours as needed.    . pregabalin (LYRICA) 75 MG capsule Take 1 capsule (75 mg total) by mouth 2 (two) times daily. (Patient not taking: Reported on 10/10/2014)    . EPINEPHrine (EPI-PEN) 0.3 mg/0.3 mL DEVI Inject 0.3 mg into the muscle once.    . mesalamine (ASACOL) 400 MG EC tablet 800 mg. One tablet by mouth twice daily     No facility-administered medications prior to visit.    PAST MEDICAL HISTORY: Past Medical History  Diagnosis Date  . BENIGN PROSTATIC  HYPERTROPHY, HX OF 02/17/2007    Qualifier: Diagnosis of  By: [place], [place]    . DIVERTICULITIS-COLON 02/04/2008    Qualifier: Diagnosis of  By: Sharlett Iles MD Byrd Hesselbach ERECTILE DYSFUNCTION, MILD 02/17/2007    Qualifier: Diagnosis of  By: [place], [place]    . HYPERGLYCEMIA 02/17/2007    Qualifier: Diagnosis of  By: Danny Lawless CMA, [place]    . HYPERLIPIDEMIA 02/17/2007    Qualifier: Diagnosis of  By: Danny Lawless CMA, [place]    . HYPOGONADISM 02/17/2007    Qualifier: Diagnosis of  By: Danny Lawless CMA, [place]    . OVERACTIVE BLADDER 02/17/2007    Qualifier: Diagnosis of  By: Danny Lawless CMA, [place]    . PERIPHERAL NEUROPATHY 02/17/2007    Qualifier: Diagnosis of  By: [place], [place]    . PERIPHERAL VASCULAR DISEASE 02/17/2007    Qualifier: Diagnosis of  By: [place], [place]    . Regional enteritis of large intestine ([place]) 06/23/2008    Qualifier: Diagnosis of  By: Sharlett Iles MD FACG, [place], HX OF 12/26/2009    Qualifier: Diagnosis of  By: [place] CMA, [place]    . TONSILLECTOMY, HX OF 02/17/2007    Qualifier: Diagnosis of  By: Danny Lawless CMA, [place]      PAST SURGICAL HISTORY: Past Surgical History  Procedure Laterality Date  . Cervical hemilaminectomy      C5-6, C6-7  . Shoulder arthroscopy    . Septoplasty    . Dupuytrens contracture  relief    . Tonsillectomy    . Ectropion correction  2010    Left lower eye lid    FAMILY HISTORY: Family History  Problem Relation Age of Onset  . Diabetes Mother   . Alzheimer's disease Father   . Colon cancer Neg Hx   . Prostate cancer Neg Hx   . Stroke Neg Hx   . Coronary artery disease Neg Hx     SOCIAL HISTORY:  Social History   Social History  . Marital Status: Single    Spouse Name: N/A  . Number of Children: 0  . Years of Education: 16   Occupational History  . retired    Social History Main Topics  . Smoking status: Former Smoker    Quit date: 11/22/1988  . Smokeless tobacco: Never Used  . Alcohol Use:  0.0 oz/week    0 Standard drinks or equivalent per week     Comment: 1 beer/month  . Drug Use: No  . Sexual Activity: Not on file   Other Topics Concern  . Not on file   Social History Narrative   [employer], [employer]-undergrad. Married '67-'87. He is in a long term relationship (June '13). No children. Work: retired. End-of-life: Full code but no prolonged heroic measures or futile care   Caffeine - none           PHYSICAL EXAM  GENERAL EXAM/CONSTITUTIONAL: Vitals:  Filed Vitals:   11/23/14 1123  BP: 136/74  Pulse: 102  Height: 5' 9"  (1.753 m)  Weight: 198 lb 6.4 oz (89.994 kg)     Body mass index is 29.29 kg/(m^2).  Visual Acuity Screening   Right eye Left eye Both eyes  Without correction:     With correction: 20/30 20/30      Patient is in no distress; well developed, nourished and groomed; neck is supple  CARDIOVASCULAR:  Examination of carotid arteries is normal; no carotid bruits  Regular rate and rhythm, no murmurs  Examination of peripheral vascular system by observation and palpation is normal  EYES:  Ophthalmoscopic exam of optic discs and posterior segments is normal; no papilledema or hemorrhages  MUSCULOSKELETAL:  Gait, strength, tone, movements noted in Neurologic exam below  NEUROLOGIC: MENTAL STATUS:  No flowsheet data found.  awake, alert, oriented to person, place and time  recent and remote memory intact  normal attention and concentration  language fluent, comprehension intact, naming intact,   fund of knowledge appropriate  CRANIAL NERVE:   2nd - no papilledema on fundoscopic exam  2nd, 3rd, 4th, 6th - pupils equal and reactive to light, visual fields full to confrontation, extraocular muscles intact, no nystagmus  5th - facial sensation symmetric  7th - facial strength symmetric  8th - hearing intact  9th - palate elevates symmetrically, uvula midline  11th - shoulder shrug symmetric  12th - tongue protrusion  midline  MOTOR:   normal bulk and tone, full strength in the BUE, BLE  SENSORY:   normal and symmetric to light touch; ABSENT VIB AT TOES AND ANKLES  COORDINATION:   finger-nose-finger, fine finger movements normal  REFLEXES:   deep tendon reflexes TRACE symmetric  GAIT/STATION:   narrow based gait; romberg is negative    DIAGNOSTIC DATA (LABS, IMAGING, TESTING) - I reviewed patient records, labs, notes, testing and imaging myself where available.  Lab Results  Component Value Date   WBC 6.2 06/09/2014   HGB 14.6 06/09/2014   HCT 43.4 06/09/2014   MCV 96.0 06/09/2014  PLT 243.0 06/09/2014      Component Value Date/Time   NA 142 10/10/2014 0854   K 4.4 10/10/2014 0854   CL 105 10/10/2014 0854   CO2 27 10/10/2014 0854   GLUCOSE 103* 10/10/2014 0854   BUN 17 10/10/2014 0854   CREATININE 0.90 10/10/2014 0854   CALCIUM 9.1 10/10/2014 0854   PROT 7.4 10/10/2014 0854   ALBUMIN 4.2 10/10/2014 0854   AST 26 10/10/2014 0854   ALT 44 10/10/2014 0854   ALKPHOS 41 10/10/2014 0854   BILITOT 1.3* 10/10/2014 0854   GFRNONAA 100.60 11/22/2008 1126   GFRAA 94 01/13/2008 0928   Lab Results  Component Value Date   CHOL 219* 10/10/2014   HDL 29.10* 10/10/2014   LDLCALC 150* 10/10/2014   LDLDIRECT 97.2 11/30/2013   TRIG 200.0* 10/10/2014   CHOLHDL 8 10/10/2014   Lab Results  Component Value Date   HGBA1C 5.9 06/10/2014   No results found for: VITAMINB12 Lab Results  Component Value Date   TSH 3.02 09/24/2006    07/31/14 MRI lumbar spine [I reviewed images myself and agree with interpretation. -VRP]  1. At L4-5 there is a mild broad-based disc bulge with severe bilateral facet arthropathy and lateral recess stenosis. Grade 1 anterolisthesis of L4 on L5 secondary to facet disease.  2. At L3-4 there is a mild broad-based disc bulge. Moderate bilateral facet arthropathy. Mild bilateral lateral recess stenosis. Mild left foraminal stenosis. 3. At L5-S1 there is a  moderate broad-based disc osteophyte complex with moderate bilateral foraminal stenosis.    ASSESSMENT AND PLAN  81 y.o. year old male here with idiopathic neuropathy since 2000. We'll repeat neuropathy labs to look for underlying cause. May represent small fiber idiopathic neuropathy.   Dx: Hereditary and idiopathic peripheral neuropathy    PLAN: - check neuropathy labs - try neuropathy cream - consider duloxetine  Orders Placed This Encounter  Procedures  . Neuropathy Panel    Return in about 3 months (around 02/23/2015).    Penni Bombard, MD 31/43/8887, 57:97 AM Certified in Neurology, Neurophysiology and Neuroimaging  [facility] [address] [place], [place] [address] [phone]

## 2014-11-23 NOTE — Patient Instructions (Signed)
Thank you for coming to see [place] at [facility]. I hope we have been able to provide you high quality care today.  You may receive a patient satisfaction survey over the next few weeks. We would appreciate your feedback and comments so that we may continue to improve ourselves and the health of our patients.  - try compounded neuropathy cream - I will check neuropathy labs   ~~~~~~~~~~~~~~~~~~~~~~~~~~~~~~~~~~~~~~~~~~~~~~~~~~~~~~~~~~~~~~~~~  DR. Bralin Garry'S GUIDE TO HAPPY AND HEALTHY LIVING These are some of my general health and wellness recommendations. Some of them may apply to you better than others. Please use common sense as you try these suggestions and feel free to ask me any questions.   ACTIVITY/FITNESS Mental, social, emotional and physical stimulation are very important for brain and body health. Try learning a new activity (arts, music, language, sports, games).  Keep moving your body to the best of your abilities. You can do this at home, inside or outside, the park, community center, gym or anywhere you like. Consider a physical therapist or personal trainer to get started. Consider the app Sworkit. Fitness trackers such as smart-watches, smart-phones or Fitbits can help as well.   NUTRITION Eat more plants: colorful vegetables, nuts, seeds and berries.  Eat less sugar, salt, preservatives and processed foods.  Avoid toxins such as cigarettes and alcohol.  Drink water when you are thirsty. Warm water with a slice of lemon is an excellent morning drink to start the day.  Consider these websites for more information The Nutrition Source (https://www.henry-hernandez.biz/) Precision Nutrition (WindowBlog.ch)   RELAXATION Consider practicing mindfulness meditation or other relaxation techniques such as deep breathing, prayer, yoga, tai chi, massage. See website mindful.org or the apps Headspace or Calm to help get  started.   SLEEP Try to get at least 7-8+ hours sleep per day. Regular exercise and reduced caffeine will help you sleep better. Practice good sleep hygeine techniques. See website sleep.org for more information.   PLANNING Prepare estate planning, living will, healthcare POA documents. Sometimes this is best planned with the help of an attorney. Theconversationproject.org and agingwithdignity.org are excellent resources.

## 2014-11-25 LAB — NEUROPATHY PANEL
A/G RATIO SPE: 1.1 (ref 0.7–1.7)
ALPHA 2: 0.7 g/dL (ref 0.4–1.0)
ANA: NEGATIVE
Albumin ELP: 3.3 g/dL (ref 2.9–4.4)
Alpha 1: 0.2 g/dL (ref 0.0–0.4)
Angio Convert Enzyme: 20 U/L (ref 14–82)
Beta: 1.1 g/dL (ref 0.7–1.3)
GAMMA GLOBULIN: 0.8 g/dL (ref 0.4–1.8)
GLOBULIN, TOTAL: 2.9 g/dL (ref 2.2–3.9)
Rhuematoid fact SerPl-aCnc: <10 IU/mL (ref 0.0–13.9)
SED RATE: 4 mm/h (ref 0–30)
TSH: 4.25 u[IU]/mL (ref 0.450–4.500)
Total Protein: 6.2 g/dL (ref 6.0–8.5)
VIT D 25 HYDROXY: 14.3 ng/mL — AB (ref 30.0–100.0)
VITAMIN B 12: 480 pg/mL (ref 211–946)

## 2014-11-30 ENCOUNTER — Telehealth: Payer: Self-pay | Admitting: *Deleted

## 2014-11-30 NOTE — Telephone Encounter (Signed)
Spoke with patient and informed him his labs are normal with the exception of low Vit D. Advised he buy OTC Vit D and take 1000 units daily. He requested copy of labs; informed patient that he can print from My Chart after labs are released by Dr Leta Baptist; informed him Dr Leta Baptist returns to office on 12/05/14. He verbalized understanding, appreciation for call.

## 2014-12-26 ENCOUNTER — Telehealth: Payer: Self-pay | Admitting: Diagnostic Neuroimaging

## 2014-12-26 NOTE — Telephone Encounter (Signed)
Patient called to advise he cannot find a pharmacy that can fill Rx for compound cream/ formulary cream.Medicare and [employer] will not cover the medication,

## 2014-12-26 NOTE — Telephone Encounter (Signed)
Spoke with Elray Mcgregor at [facility]. She states that the pharmacy reformulated his compound to get it to lowest cost. She states she has record of pharmacy talking to patient on 12/14/14, and he stated he [facility] if [facility] cream. She states they have had no further communication with patient. She states that he was not pleased with their cost of $1 per gram. Thanked her for information. Will try to reach patient tomorrow.

## 2014-12-26 NOTE — Telephone Encounter (Signed)
LVM requesting patient call back and give which pharmacies he has already tried to have compound cream prescription filled. Left this caller's name, number.

## 2014-12-27 NOTE — Telephone Encounter (Signed)
Patient called back to advise he has only tried [facility] and they cannot fill this kind of compound prescription. Patient would like to speak with nurse and he will be leaving at 4pm today.

## 2014-12-27 NOTE — Telephone Encounter (Signed)
Spoke with patient and gave him name, number, address of three local pharmacies that do compounded creams. He stated he will call them, verbalized understanding, appreciation for call.

## 2015-02-13 ENCOUNTER — Telehealth: Payer: Self-pay | Admitting: Diagnostic Neuroimaging

## 2015-02-13 NOTE — Telephone Encounter (Signed)
Spoke with patient who stated he was prescribed a neuropathy cream by Dr Leta Baptist, but  two of [facility] pharmacies he called would not make compounded cream; the other pharmacy stated it would cost > $300. He stated he received Cymbalta prescription from [place] with 3 refills. He stated that he cancelled his FU with Dr Leta Baptist because he didn't know if there was a need to follow up since he did not get cream. He stated currently his back and knee pain is bothering him more that his neuropathy. He stated his feet only bother him if he is on them for a long period of time. Informed him would route this note to Dr Leta Baptist and only call him back if Dr Leta Baptist still felt he should follow up with him. Patient verbalized understanding, appreciation for call.

## 2015-02-13 NOTE — Telephone Encounter (Signed)
No further recs at this time. Follow up as needed. -VRP

## 2015-02-13 NOTE — Telephone Encounter (Signed)
Pt called and would like a call back about his medication. He does not know the name of the medication. He did state that it is a cream. Please call and advise [phone]

## 2015-03-08 ENCOUNTER — Ambulatory Visit: Payer: Non-veteran care | Admitting: Diagnostic Neuroimaging

## 2015-04-18 ENCOUNTER — Telehealth: Payer: Self-pay | Admitting: Diagnostic Neuroimaging

## 2015-04-18 NOTE — Telephone Encounter (Signed)
Spoke with patient who is requesting to be seen for FU. He stated he did not get message in March to call and reschedule his FU. Scheduled FU for next Wed. He verbalized understanding, appreciation.

## 2015-04-18 NOTE — Telephone Encounter (Signed)
Patient called, advised that he was seeing Dr. Leta Baptist through the [facility] choice program, they were funding his visits to Dr. Leta Baptist. Patient had a scheduled appointment with Dr. Leta Baptist in March 2017 (cancelled by our office). Needs to know if Dr. Leta Baptist has any further plans to help him with his neuropathy. Please call to advise. Please leave message, identify caller and leave message.

## 2015-04-26 ENCOUNTER — Ambulatory Visit (INDEPENDENT_AMBULATORY_CARE_PROVIDER_SITE_OTHER): Payer: Medicare HMO | Admitting: Diagnostic Neuroimaging

## 2015-04-26 ENCOUNTER — Encounter: Payer: Self-pay | Admitting: Diagnostic Neuroimaging

## 2015-04-26 VITALS — BP 86/51 | HR 95 | Ht 69.0 in | Wt 193.4 lb

## 2015-04-26 DIAGNOSIS — G609 Hereditary and idiopathic neuropathy, unspecified: Secondary | ICD-10-CM | POA: Diagnosis not present

## 2015-04-26 DIAGNOSIS — M5441 Lumbago with sciatica, right side: Secondary | ICD-10-CM | POA: Diagnosis not present

## 2015-04-26 DIAGNOSIS — R55 Syncope and collapse: Secondary | ICD-10-CM

## 2015-04-26 DIAGNOSIS — I959 Hypotension, unspecified: Secondary | ICD-10-CM | POA: Diagnosis not present

## 2015-04-26 NOTE — Patient Instructions (Signed)
-   continue current medications - follow up with cardiology and PCP

## 2015-04-26 NOTE — Progress Notes (Signed)
[facility]  PATIENT: Edward English DOB: 08/03/1933  REFERRING CLINICIAN: C Mulles  HISTORY FROM: patient  REASON FOR VISIT: follow up     HISTORICAL  CHIEF COMPLAINT:  Chief Complaint  Patient presents with  . Hereditary and idiopathic neuropathy    rm 6, "did not buy neuropathy cream- too exppensive; Cymbalta helps"  . Follow-up    HISTORY OF PRESENT ILLNESS:   UPDATE 04/26/15: Since last visit, now on cymbalta since Jan 2017, which helps back pain and neuropathy pain. Neuropathy cream too expensive, so did not try. Also has had 4 episodes of near syncope over the past 1 year (hot, sweating, lightheaded, falling to ground, could hear, but not respond, not full LOC).   PRIOR HPI (11/23/15): 82 year old male here for evaluation of idiopathic neuropathy. Patient was diagnosed in 2000 after onset of numbness, tingling, burning in toes and feet. Symptoms have slowly and gradually increased over time. He is tried gabapentin, amitriptyline, Lyrica without relief. Currently he is not any neuropathy medications. He does not have diabetes. Symptoms have gradually progressed but not progressed above his ankles. No problems in his calves are upper legs. No problems with his fingers or hands. He does have chronic low back pain. He had MRI of the lumbar spine recently.   REVIEW OF SYSTEMS: Full 14 system review of systems performed and negative except: dizziness near fainting eye itching excessive sweating ringing in ears back pain.    Allergies  Allergen Reactions  . Valdecoxib Rash    HOME MEDICATIONS: Outpatient Prescriptions Prior to Visit  Medication Sig Dispense Refill  . acetaminophen (TYLENOL) 500 MG tablet Take 500 mg by mouth every 6 (six) hours as needed.    . DULoxetine (CYMBALTA) 60 MG capsule Take 60 mg by mouth daily.    Marland Kitchen tolterodine (DETROL) 2 MG tablet Take 2 mg by mouth 2 (two) times daily.    . diclofenac (VOLTAREN) 75 MG EC tablet Take 75 mg  by mouth 2 (two) times daily.    . pregabalin (LYRICA) 75 MG capsule Take 1 capsule (75 mg total) by mouth 2 (two) times daily. (Patient not taking: Reported on 10/10/2014)     No facility-administered medications prior to visit.    PAST MEDICAL HISTORY: Past Medical History  Diagnosis Date  . BENIGN PROSTATIC HYPERTROPHY, HX OF 02/17/2007    Qualifier: Diagnosis of  By: [place], [place]    . DIVERTICULITIS-COLON 02/04/2008    Qualifier: Diagnosis of  By: Sharlett Iles MD Byrd Hesselbach ERECTILE DYSFUNCTION, MILD 02/17/2007    Qualifier: Diagnosis of  By: [place], [place]    . HYPERGLYCEMIA 02/17/2007    Qualifier: Diagnosis of  By: Danny Lawless CMA, [place]    . HYPERLIPIDEMIA 02/17/2007    Qualifier: Diagnosis of  By: Danny Lawless CMA, [place]    . HYPOGONADISM 02/17/2007    Qualifier: Diagnosis of  By: Danny Lawless CMA, [place]    . OVERACTIVE BLADDER 02/17/2007    Qualifier: Diagnosis of  By: Danny Lawless CMA, [place]    . PERIPHERAL NEUROPATHY 02/17/2007    Qualifier: Diagnosis of  By: [place], [place]    . PERIPHERAL VASCULAR DISEASE 02/17/2007    Qualifier: Diagnosis of  By: [place], [place]    . Regional enteritis of large intestine ([place]) 06/23/2008    Qualifier: Diagnosis of  By: Sharlett Iles MD FACG, [place], HX OF 12/26/2009    Qualifier: Diagnosis of  By: Bullins CMA, [place]    .  TONSILLECTOMY, HX OF 02/17/2007    Qualifier: Diagnosis of  By: Danny Lawless CMA, [place]      PAST SURGICAL HISTORY: Past Surgical History  Procedure Laterality Date  . Cervical hemilaminectomy      C5-6, C6-7  . Shoulder arthroscopy    . Septoplasty    . Dupuytrens contracture relief    . Tonsillectomy    . Ectropion correction  2010    Left lower eye lid    FAMILY HISTORY: Family History  Problem Relation Age of Onset  . Diabetes Mother   . Alzheimer's disease Father   . Colon cancer Neg Hx   . Prostate cancer Neg Hx   . Stroke Neg Hx   . Coronary artery disease Neg Hx     SOCIAL  HISTORY:  Social History   Social History  . Marital Status: Single    Spouse Name: N/A  . Number of Children: 0  . Years of Education: 16   Occupational History  . retired    Social History Main Topics  . Smoking status: Former Smoker    Quit date: 11/22/1988  . Smokeless tobacco: Never Used  . Alcohol Use: 0.0 oz/week    0 Standard drinks or equivalent per week     Comment: 1 beer/month  . Drug Use: No  . Sexual Activity: Not on file   Other Topics Concern  . Not on file   Social History Narrative   HS[employer], [employer]-undergrad. Married '67-'87. He is in a long term relationship (June '13). No children. Work: retired. End-of-life: Full code but no prolonged heroic measures or futile care   Caffeine - none           PHYSICAL EXAM  GENERAL EXAM/CONSTITUTIONAL: Vitals:  Filed Vitals:   04/26/15 1439 04/26/15 1446  BP: 89/51 86/51  Pulse: 90 95  Height: 5' 9"  (1.753 m)   Weight: 193 lb 6.4 oz (87.726 kg)    Body mass index is 28.55 kg/(m^2). No exam data present  Patient is in no distress; well developed, nourished and groomed; neck is supple  CARDIOVASCULAR:  Examination of carotid arteries is normal; no carotid bruits  Regular rate and rhythm, no murmurs  Examination of peripheral vascular system by observation and palpation is normal  EYES:  Ophthalmoscopic exam of optic discs and posterior segments is normal; no papilledema or hemorrhages  MUSCULOSKELETAL:  Gait, strength, tone, movements noted in Neurologic exam below  NEUROLOGIC: MENTAL STATUS:  No flowsheet data found.  awake, alert, oriented to person, place and time  recent and remote memory intact  normal attention and concentration  language fluent, comprehension intact, naming intact,   fund of knowledge appropriate  CRANIAL NERVE:   2nd - no papilledema on fundoscopic exam  2nd, 3rd, 4th, 6th - pupils equal and reactive to light, visual fields full to confrontation,  extraocular muscles intact, no nystagmus  5th - facial sensation symmetric  7th - facial strength symmetric  8th - hearing intact  9th - palate elevates symmetrically, uvula midline  11th - shoulder shrug symmetric  12th - tongue protrusion midline  MOTOR:   normal bulk and tone, full strength in the BUE, BLE  SENSORY:   normal and symmetric to light touch; ABSENT VIB AT TOES AND ANKLES  COORDINATION:   finger-nose-finger, fine finger movements normal  REFLEXES:   deep tendon reflexes TRACE symmetric  GAIT/STATION:   narrow based gait; romberg is negative    DIAGNOSTIC DATA (LABS, IMAGING, TESTING) - I reviewed  patient records, labs, notes, testing and imaging myself where available.  Lab Results  Component Value Date   WBC 6.2 06/09/2014   HGB 14.6 06/09/2014   HCT 43.4 06/09/2014   MCV 96.0 06/09/2014   PLT 243.0 06/09/2014      Component Value Date/Time   NA 142 10/10/2014 0854   K 4.4 10/10/2014 0854   CL 105 10/10/2014 0854   CO2 27 10/10/2014 0854   GLUCOSE 103* 10/10/2014 0854   BUN 17 10/10/2014 0854   CREATININE 0.90 10/10/2014 0854   CALCIUM 9.1 10/10/2014 0854   PROT 6.2 11/23/2014 1555   PROT 7.4 10/10/2014 0854   ALBUMIN 4.2 10/10/2014 0854   AST 26 10/10/2014 0854   ALT 44 10/10/2014 0854   ALKPHOS 41 10/10/2014 0854   BILITOT 1.3* 10/10/2014 0854   GFRNONAA 100.60 11/22/2008 1126   GFRAA 94 01/13/2008 0928   Lab Results  Component Value Date   CHOL 219* 10/10/2014   HDL 29.10* 10/10/2014   LDLCALC 150* 10/10/2014   LDLDIRECT 97.2 11/30/2013   TRIG 200.0* 10/10/2014   CHOLHDL 8 10/10/2014   Lab Results  Component Value Date   HGBA1C 5.9 06/10/2014   Lab Results  Component Value Date   VITAMINB12 480 11/23/2014   Lab Results  Component Value Date   TSH 4.250 11/23/2014   VIT D, 25-HYDROXY  Date Value Ref Range Status  11/23/2014 14.3* 30.0 - 100.0 ng/mL Final    Comment:    Vitamin D deficiency has been defined  by the [place] practice guideline as a level of serum 25-OH vitamin D less than 20 ng/mL (1,2). The Endocrine Society went on to further define vitamin D insufficiency as a level between 21 and 29 ng/mL (2). 1. IOM (Institute of Medicine). 2010. Dietary reference    intakes for calcium and D. [place]: The    [employer]. 2. Holick MF, Binkley , Bischoff-Ferrari HA, et al.    Evaluation, treatment, and prevention of vitamin D    deficiency: an Endocrine Society clinical practice    guideline. JCEM. 2011 Jul; 96(7):1911-30.    Lab Results  Component Value Date   ANA Negative 11/23/2014    07/31/14 MRI lumbar spine [I reviewed images myself and agree with interpretation. -VRP]  1. At L4-5 there is a mild broad-based disc bulge with severe bilateral facet arthropathy and lateral recess stenosis. Grade 1 anterolisthesis of L4 on L5 secondary to facet disease.  2. At L3-4 there is a mild broad-based disc bulge. Moderate bilateral facet arthropathy. Mild bilateral lateral recess stenosis. Mild left foraminal stenosis. 3. At L5-S1 there is a moderate broad-based disc osteophyte complex with moderate bilateral foraminal stenosis.     ASSESSMENT AND PLAN  82 y.o. year old male here with idiopathic neuropathy since 2000. Neuropathy labs unremarkable except low vitamin D (now on replacement). May represent small fiber idiopathic neuropathy.   Also with 4 new episodes near syncope (hot, lightheadeded, sweating).   Dx:  Hereditary and idiopathic peripheral neuropathy  Right-sided low back pain with right-sided sciatica  Near syncope  Hypotension, unspecified hypotension type    PLAN: - continue duloxetine - follow up with pain mgmt for low back pain - agree with cardiology evaluation (appt is tomorrow) for near syncope; also with low BP (86/51; asymptomatic today); advised increased water intake; caution with driving until  cardiology evaluation  Return if symptoms worsen or fail to improve, for return to PCP.  Penni Bombard, MD 01/17/5518, 8:02 PM Certified in Neurology, Neurophysiology and Neuroimaging  [facility] [address] [place], [place] [address] [phone]

## 2015-06-26 ENCOUNTER — Ambulatory Visit (INDEPENDENT_AMBULATORY_CARE_PROVIDER_SITE_OTHER): Payer: Medicare HMO | Admitting: Family Medicine

## 2015-06-26 ENCOUNTER — Encounter: Payer: Self-pay | Admitting: Family Medicine

## 2015-06-26 ENCOUNTER — Other Ambulatory Visit: Payer: Self-pay

## 2015-06-26 VITALS — BP 116/70 | HR 77 | Ht 69.0 in | Wt 193.0 lb

## 2015-06-26 DIAGNOSIS — M129 Arthropathy, unspecified: Secondary | ICD-10-CM

## 2015-06-26 DIAGNOSIS — IMO0002 Reserved for concepts with insufficient information to code with codable children: Secondary | ICD-10-CM

## 2015-06-26 NOTE — Progress Notes (Signed)
Pre visit review using our clinic review tool, if applicable. No additional management support is needed unless otherwise documented below in the visit note. 

## 2015-06-26 NOTE — Assessment & Plan Note (Signed)
Patient appeared didn't respond fairly well to the injection today. We discussed icing regimen, home exercises, we discussed topical anti-inflammatories and given a trial size. We discussed custom bracing and patient will consider. Patient would be a candidate for viscous supplementation if needed. Patient was to avoid any type of surgical intervention at this time. We did discuss with patient having only moderate osteophytic changes there is a chance we could get an MRI for further evaluation to see if any arthroscopic procedure could be done. Patient is hoping that we can improve the pain with the conservative therapy. Follow-up again for 6 weeks.  Spent  25 minutes with patient face-to-face and had greater than 50% of counseling including as described above in assessment and plan.

## 2015-06-26 NOTE — Progress Notes (Signed)
[facility] [address] [place], [place] [address] Phone: [phone] Subjective:    I'm seeing this patient by the request  of:  Hoyt Koch, MD   CC: Right knee pain  [id number] Edward English is a 82 y.o. male coming in with complaint of right knee pain. Patient was last seen many months ago. Was found to have moderate osteophytic changes of the knee. Patient states that he had been doing relatively well but seems to have worsening symptoms recently. States that there is some instability. States that sometimes it can be some swelling. Patient states it is affecting some daily activities such as going up and down stairs.  Past Medical History  Diagnosis Date  . BENIGN PROSTATIC HYPERTROPHY, HX OF 02/17/2007    Qualifier: Diagnosis of  By: [place], [place]    . DIVERTICULITIS-COLON 02/04/2008    Qualifier: Diagnosis of  By: Sharlett Iles MD Byrd Hesselbach ERECTILE DYSFUNCTION, MILD 02/17/2007    Qualifier: Diagnosis of  By: [place], [place]    . HYPERGLYCEMIA 02/17/2007    Qualifier: Diagnosis of  By: Danny Lawless CMA, [place]    . HYPERLIPIDEMIA 02/17/2007    Qualifier: Diagnosis of  By: Danny Lawless CMA, [place]    . HYPOGONADISM 02/17/2007    Qualifier: Diagnosis of  By: Danny Lawless CMA, [place]    . OVERACTIVE BLADDER 02/17/2007    Qualifier: Diagnosis of  By: Danny Lawless CMA, [place]    . PERIPHERAL NEUROPATHY 02/17/2007    Qualifier: Diagnosis of  By: [place], [place]    . PERIPHERAL VASCULAR DISEASE 02/17/2007    Qualifier: Diagnosis of  By: [place], [place]    . Regional enteritis of large intestine ([place]) 06/23/2008    Qualifier: Diagnosis of  By: Sharlett Iles MD FACG, [place], HX OF 12/26/2009    Qualifier: Diagnosis of  By: Bullins CMA, [place]    . TONSILLECTOMY, HX OF 02/17/2007    Qualifier: Diagnosis of  By: [place], [place]     Past Surgical History  Procedure Laterality Date  . Cervical hemilaminectomy      C5-6, C6-7   . Shoulder arthroscopy    . Septoplasty    . Dupuytrens contracture relief    . Tonsillectomy    . Ectropion correction  2010    Left lower eye lid   Social History  Substance Use Topics  . Smoking status: Former Smoker    Quit date: 11/22/1988  . Smokeless tobacco: Never Used  . Alcohol Use: 0.0 oz/week    0 Standard drinks or equivalent per week     Comment: 1 beer/month   Allergies  Allergen Reactions  . Valdecoxib Rash   Family History  Problem Relation Age of Onset  . Diabetes Mother   . Alzheimer's disease Father   . Colon cancer Neg Hx   . Prostate cancer Neg Hx   . Stroke Neg Hx   . Coronary artery disease Neg Hx         Past medical history, social, surgical and family history all reviewed in electronic medical record.   Review of Systems: No headache, visual changes, nausea, vomiting, diarrhea, constipation, dizziness, abdominal pain, skin rash, fevers, chills, night sweats, weight loss, swollen lymph nodes, body aches, joint swelling, muscle aches, chest pain, shortness of breath, mood changes.   Objective There were no vitals taken for this visit.  General: No apparent distress alert and oriented x3 mood  and affect normal, dressed appropriately.  HEENT: Pupils equal, extraocular movements intact  Respiratory: Patient's speak in full sentences and does not appear short of breath  Cardiovascular: No lower extremity edema, non tender, no erythema  Skin: Warm dry intact with no signs of infection or rash on extremities or on axial skeleton.  Abdomen: Soft nontender  Neuro: Cranial nerves II through XII are intact, neurovascularly intact in all extremities with 2+ DTRs and 2+ pulses.  Lymph: No lymphadenopathy of posterior or anterior cervical chain or axillae bilaterally.  Gait normal with good balance and coordination.  MSK:  Non tender with full range of motion and good stability and symmetric strength and tone of shoulders, elbows, wrist, hip, and ankles  bilaterally.  Knee: Right Normal to inspection with no erythema or effusion or obvious bony abnormalities. Moderate to severe tenderness over the medial joint line ROM full in flexion and extension and lower leg rotation. Instability noted with valgus force Negative Mcmurray's, Apley's, and Thessalonian tests. Non painful patellar compression. Patellar glide with minimal crepitus. Patellar and quadriceps tendons unremarkable. Hamstring and quadriceps strength is normal.  Contralateral knee unremarkable  After informed written and verbal consent, patient was seated on exam table. Right knee was prepped with alcohol swab and utilizing anterolateral approach, patient's right knee space was injected with 4:1  marcaine 0.5%: Kenalog 84m/dL. Patient tolerated the procedure well without immediate complications.   Impression and Recommendations:     This case required medical decision making of moderate complexity.

## 2015-06-26 NOTE — Patient Instructions (Signed)
Good to see yo u Ice 20 minutes 2 times daily. Usually after activity and before bed. I hope the injection works a long time.   I want to see you again in 4 weeks.  If pain is worse we have some other injections that can help and we can consider a custom brace.

## 2015-08-29 NOTE — Progress Notes (Signed)
[facility] [place],  [address] Phone: [phone] Subjective:    I'm seeing this patient by the request  of:  Hoyt Koch, MD   CC: Right knee pain Follow-up  EKC:[id number]  Edward English is a 82 y.o. male coming in with complaint of right knee pain. Patient was last seen many months ago. Was found to have moderate osteophytic changes of the knee. Patient was seen 10 weeks ago and was given a corticosteroid injection. Patient states overall he is doing relatively well. Not having any significant pain. Nothing that is stopping him from activities. Does have days where it seems to be worse. Comfortable at night.  Past Medical History:  Diagnosis Date  . BENIGN PROSTATIC HYPERTROPHY, HX OF 02/17/2007   Qualifier: Diagnosis of  By: [place], [place]    . DIVERTICULITIS-COLON 02/04/2008   Qualifier: Diagnosis of  By: Sharlett Iles MD Byrd Hesselbach ERECTILE DYSFUNCTION, MILD 02/17/2007   Qualifier: Diagnosis of  By: [place], [place]    . HYPERGLYCEMIA 02/17/2007   Qualifier: Diagnosis of  By: Danny Lawless CMA, [place]    . HYPERLIPIDEMIA 02/17/2007   Qualifier: Diagnosis of  By: Danny Lawless CMA, [place]    . HYPOGONADISM 02/17/2007   Qualifier: Diagnosis of  By: Danny Lawless CMA, [place]    . OVERACTIVE BLADDER 02/17/2007   Qualifier: Diagnosis of  By: Danny Lawless CMA, [place]    . PERIPHERAL NEUROPATHY 02/17/2007   Qualifier: Diagnosis of  By: [place], [place]    . PERIPHERAL VASCULAR DISEASE 02/17/2007   Qualifier: Diagnosis of  By: [place], [place]    . Regional enteritis of large intestine ([place]) 06/23/2008   Qualifier: Diagnosis of  By: Sharlett Iles MD FACG, [place], HX OF 12/26/2009   Qualifier: Diagnosis of  By: Bullins CMA, [place]    . TONSILLECTOMY, HX OF 02/17/2007   Qualifier: Diagnosis of  By: [place], [place]     Past Surgical History:  Procedure Laterality Date  . cervical hemilaminectomy     C5-6, C6-7  .  dupuytrens contracture relief    . Ectropion correction  2010   Left lower eye lid  . SEPTOPLASTY    . SHOULDER ARTHROSCOPY    . TONSILLECTOMY     Social History  Substance Use Topics  . Smoking status: Former Smoker    Quit date: 11/22/1988  . Smokeless tobacco: Never Used  . Alcohol use 0.0 oz/week     Comment: 1 beer/month   Allergies  Allergen Reactions  . Valdecoxib Rash   Family History  Problem Relation Age of Onset  . Diabetes Mother   . Alzheimer's disease Father   . Colon cancer Neg Hx   . Prostate cancer Neg Hx   . Stroke Neg Hx   . Coronary artery disease Neg Hx         Past medical history, social, surgical and family history all reviewed in electronic medical record.   Review of Systems: No headache, visual changes, nausea, vomiting, diarrhea, constipation, dizziness, abdominal pain, skin rash, fevers, chills, night sweats, weight loss, swollen lymph nodes, body aches, joint swelling, muscle aches, chest pain, shortness of breath, mood changes.   Objective  Blood pressure 124/68, pulse 97, weight 192 lb (87.1 kg), SpO2 95 %.  General: No apparent distress alert and oriented x3 mood and affect normal, dressed appropriately.  HEENT: Pupils equal, extraocular movements intact  Respiratory: Patient's speak in  full sentences and does not appear short of breath  Cardiovascular: No lower extremity edema, non tender, no erythema  Skin: Warm dry intact with no signs of infection or rash on extremities or on axial skeleton.  Abdomen: Soft nontender  Neuro: Cranial nerves II through XII are intact, neurovascularly intact in all extremities with 2+ DTRs and 2+ pulses.  Lymph: No lymphadenopathy of posterior or anterior cervical chain or axillae bilaterally.  Gait normal with good balance and coordination.  MSK:  Non tender with full range of motion and good stability and symmetric strength and tone of shoulders, elbows, wrist, hip, and ankles bilaterally.  Knee:  Right Normal to inspection with no erythema or effusion or obvious bony abnormalities. I'll tenderness over the medial joint line ROM full in flexion and extension and lower leg rotation. Instability noted with valgus force Negative Mcmurray's, Apley's, and Thessalonian tests. Non painful patellar compression. Patellar glide with minimal crepitus. Patellar and quadriceps tendons unremarkable. Hamstring and quadriceps strength is normal.  Contralateral knee unremarkable Mild improvement from previous exam    Impression and Recommendations:     This case required medical decision making of moderate complexity.

## 2015-08-30 ENCOUNTER — Ambulatory Visit (INDEPENDENT_AMBULATORY_CARE_PROVIDER_SITE_OTHER): Payer: Medicare HMO | Admitting: Family Medicine

## 2015-08-30 ENCOUNTER — Encounter: Payer: Self-pay | Admitting: Family Medicine

## 2015-08-30 DIAGNOSIS — M129 Arthropathy, unspecified: Secondary | ICD-10-CM | POA: Diagnosis not present

## 2015-08-30 DIAGNOSIS — IMO0002 Reserved for concepts with insufficient information to code with codable children: Secondary | ICD-10-CM

## 2015-08-30 NOTE — Assessment & Plan Note (Signed)
Discussed with patient. Patient is tingling from last injection. Patient was also on an injection at this time. Knows if any worsening symptoms to make an appointment in. We discussed icing regimen. Patient would be a candidate for custom bracing as well as viscous supple mentation.

## 2015-08-30 NOTE — Patient Instructions (Addendum)
Good to see you  Overall I think you are doing well.  Make an appointment in 6 weeks and if worsening pain we can inject the knee again.  Otherwise see me when you need me.

## 2015-10-11 ENCOUNTER — Ambulatory Visit: Payer: Medicare HMO | Admitting: Family Medicine

## 2015-10-19 ENCOUNTER — Telehealth: Payer: Self-pay | Admitting: Internal Medicine

## 2015-10-19 NOTE — Telephone Encounter (Signed)
Patient sent my chart message [facility]  Please update.

## 2015-10-20 NOTE — Telephone Encounter (Signed)
Noted  

## 2016-02-10 IMAGING — CR DG THORACIC SPINE 3V
3 series · 3 of 3 positions shown · non-contrast
Comparison: Chest x-rays dated 06/09/2014 and 10/18/2010

CLINICAL DATA: Radiating upper abdominal pain for 2 weeks.

EXAM:
THORACIC SPINE - 2 VIEW + SWIMMERS

[view not recorded (1 of 3)]
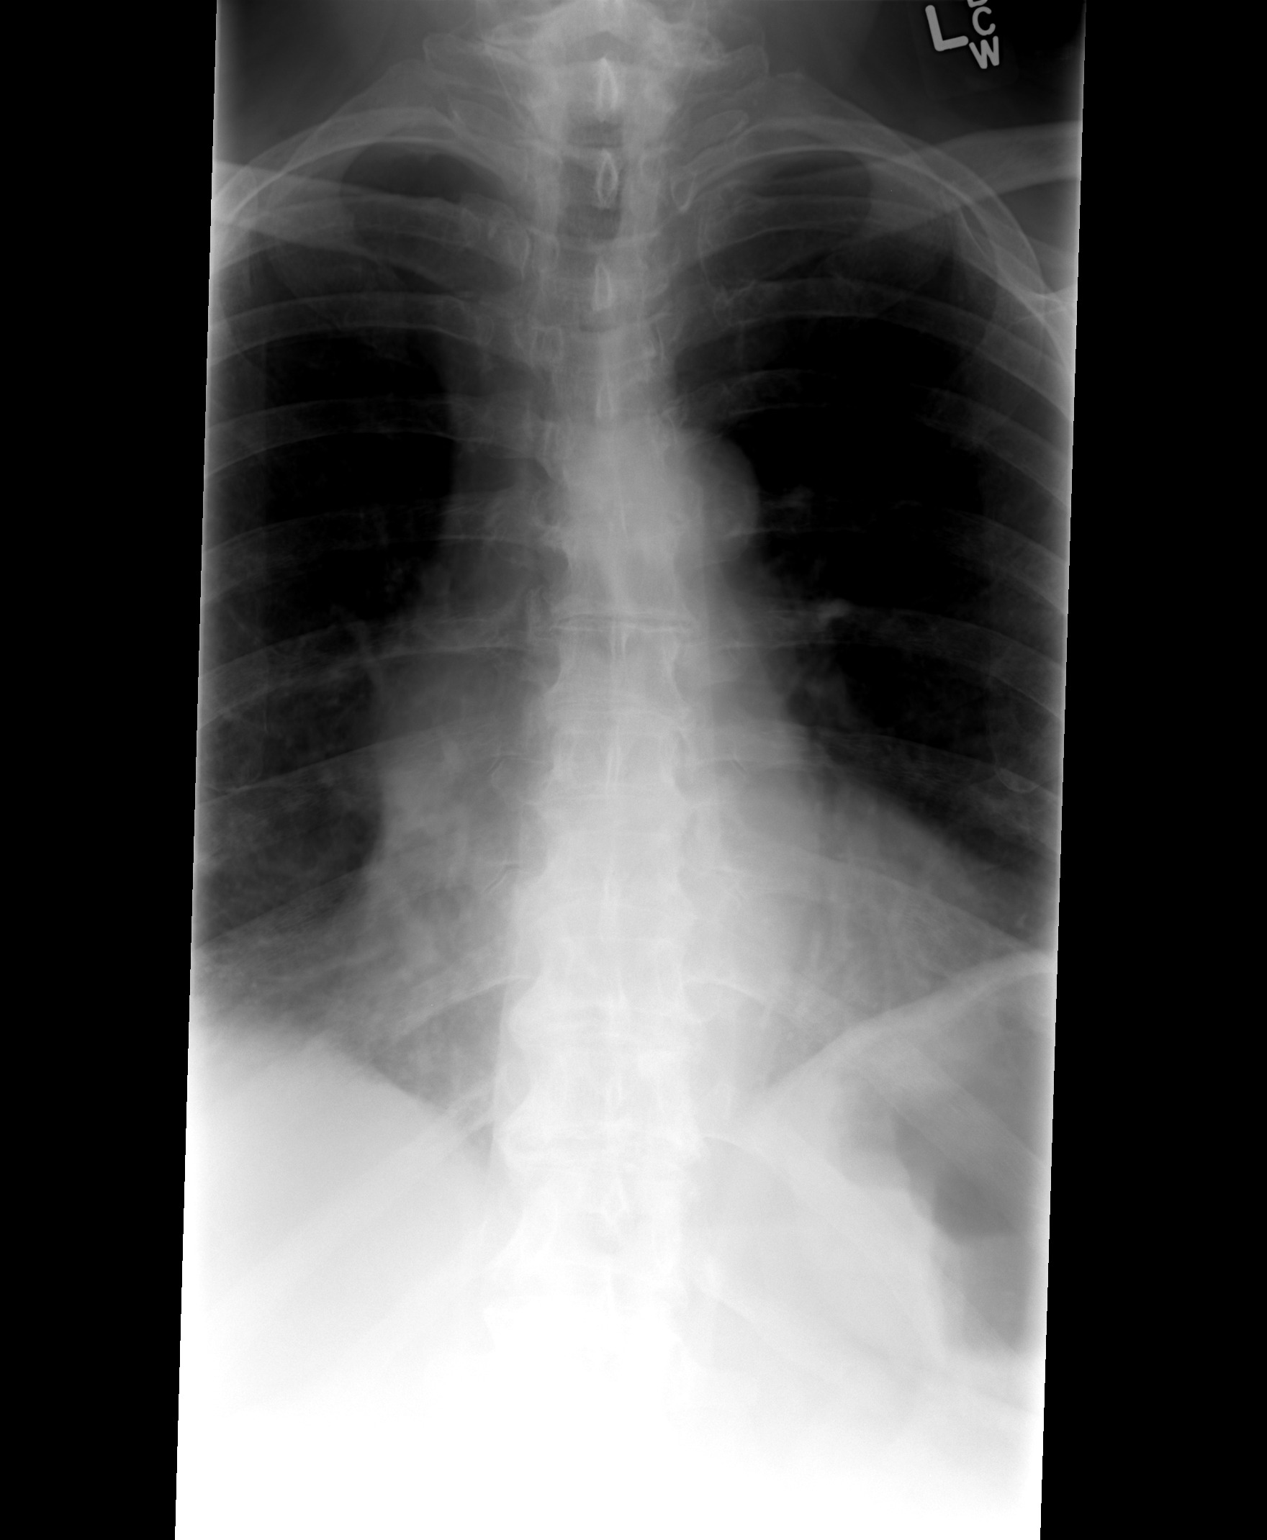

[view not recorded (2 of 3)]
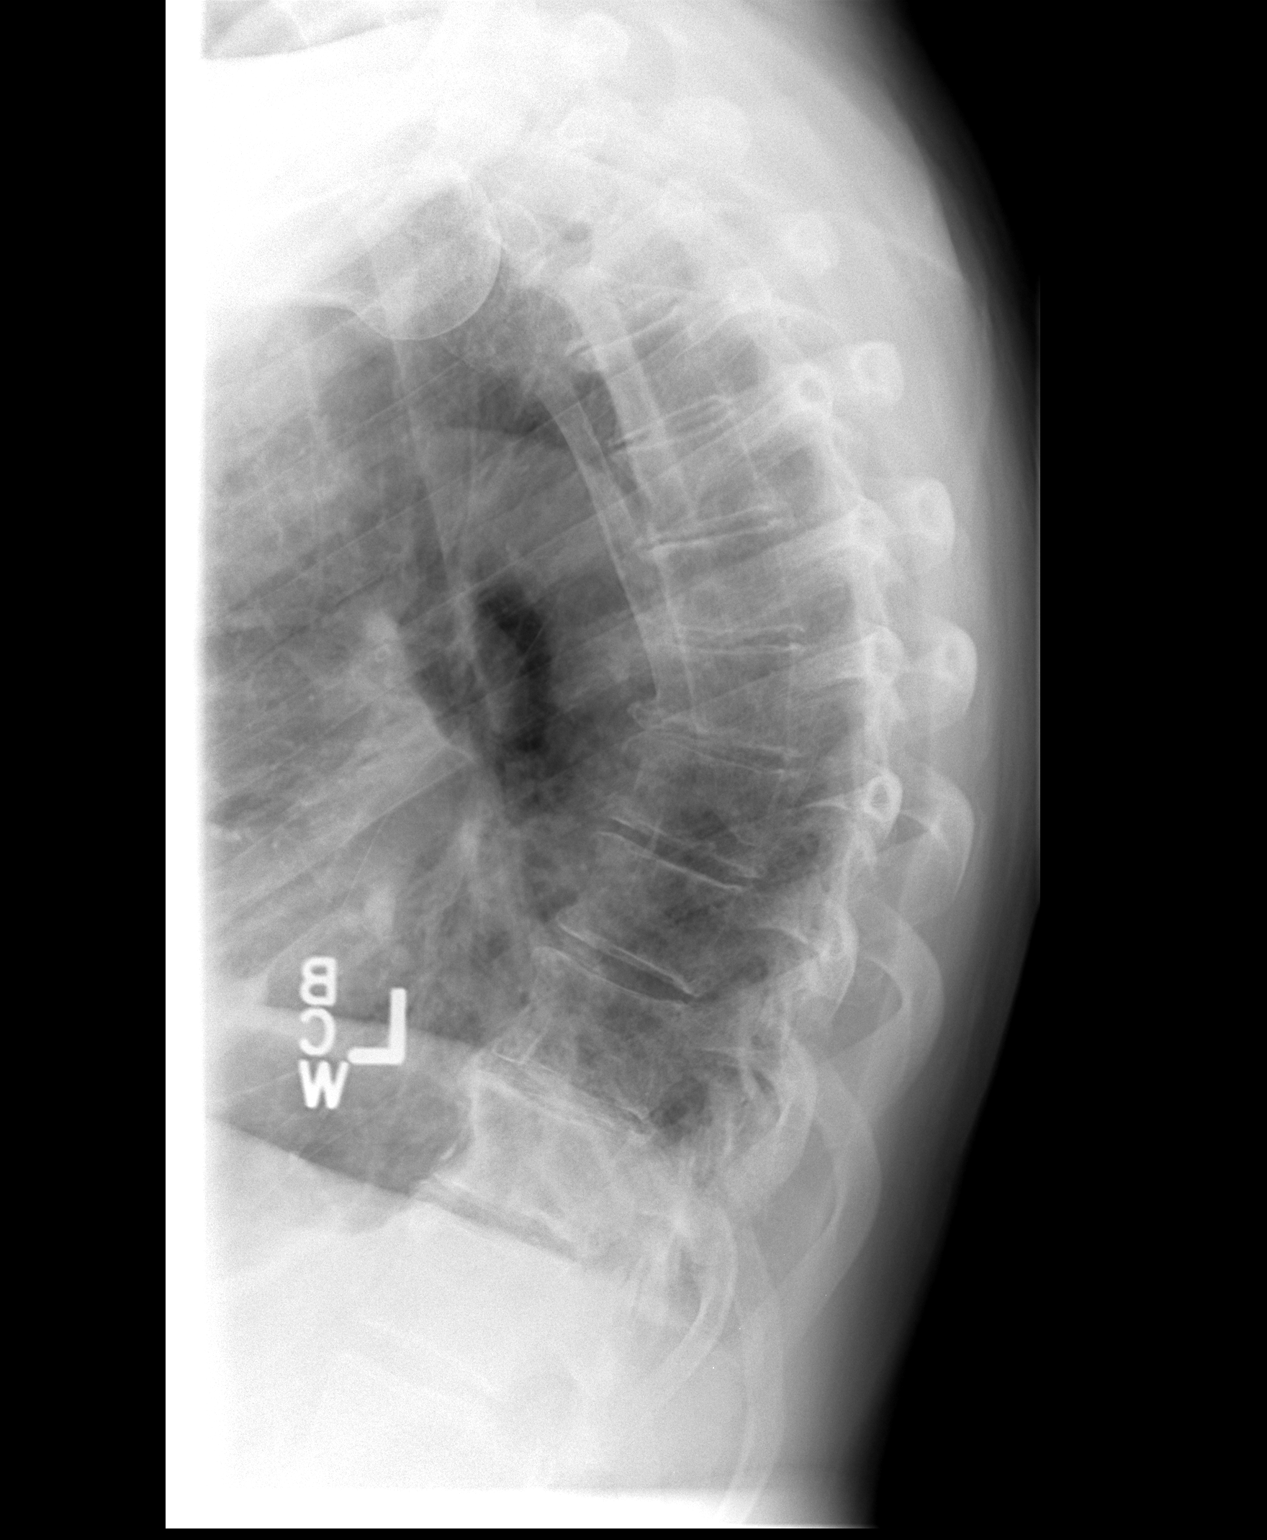

[view not recorded (3 of 3)]
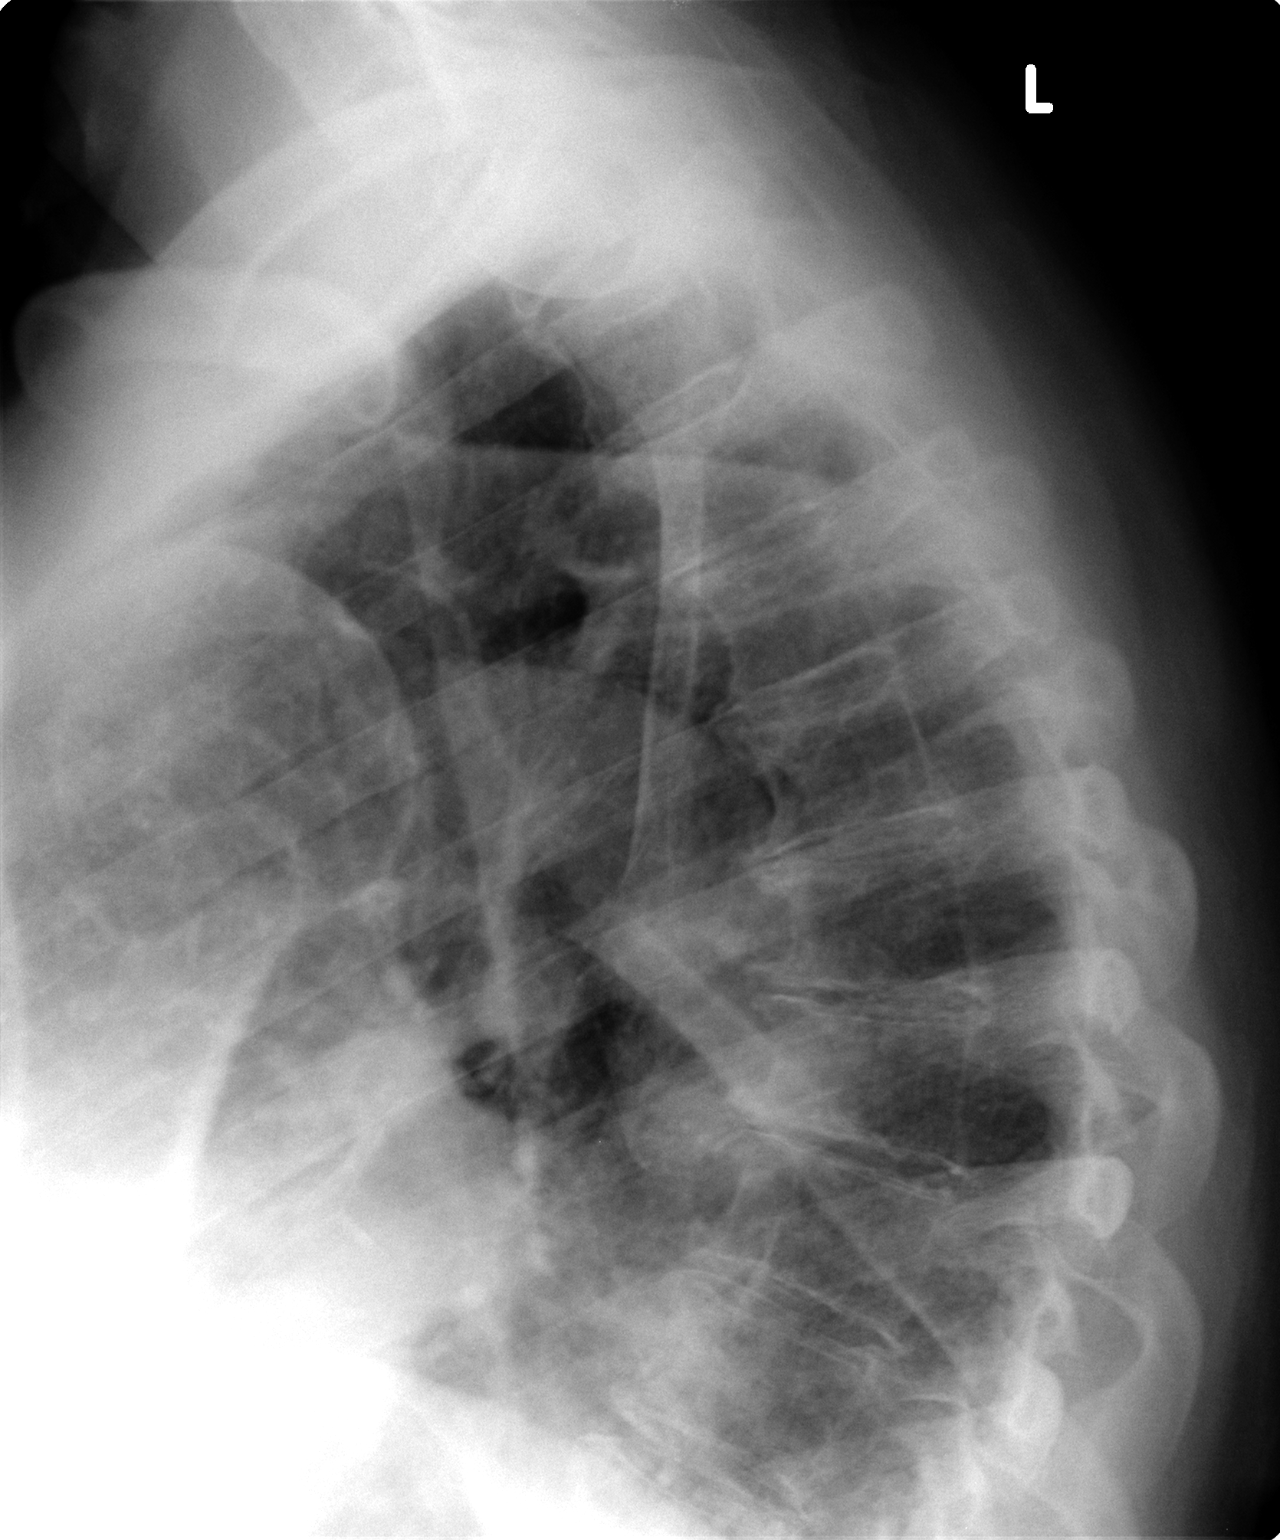

[3 of 3 positions shown; findings below may reference images not displayed]

FINDINGS: There is no fracture or bone destruction. There is chronic
accentuation of the thoracic kyphosis with slight anterior wedge
deformity of T7, unchanged since 3653.
IMPRESSION: No acute abnormality.

## 2016-02-10 IMAGING — CR DG CHEST 2V
2 series · 2 of 2 positions shown · non-contrast
Comparison: PA and lateral chest x-ray October 18, 2010

CLINICAL DATA: Upper abdominal pain for the past 2 weeks; history
of diverticulitis

EXAM:
CHEST  2 VIEW

[view not recorded (1 of 2)]
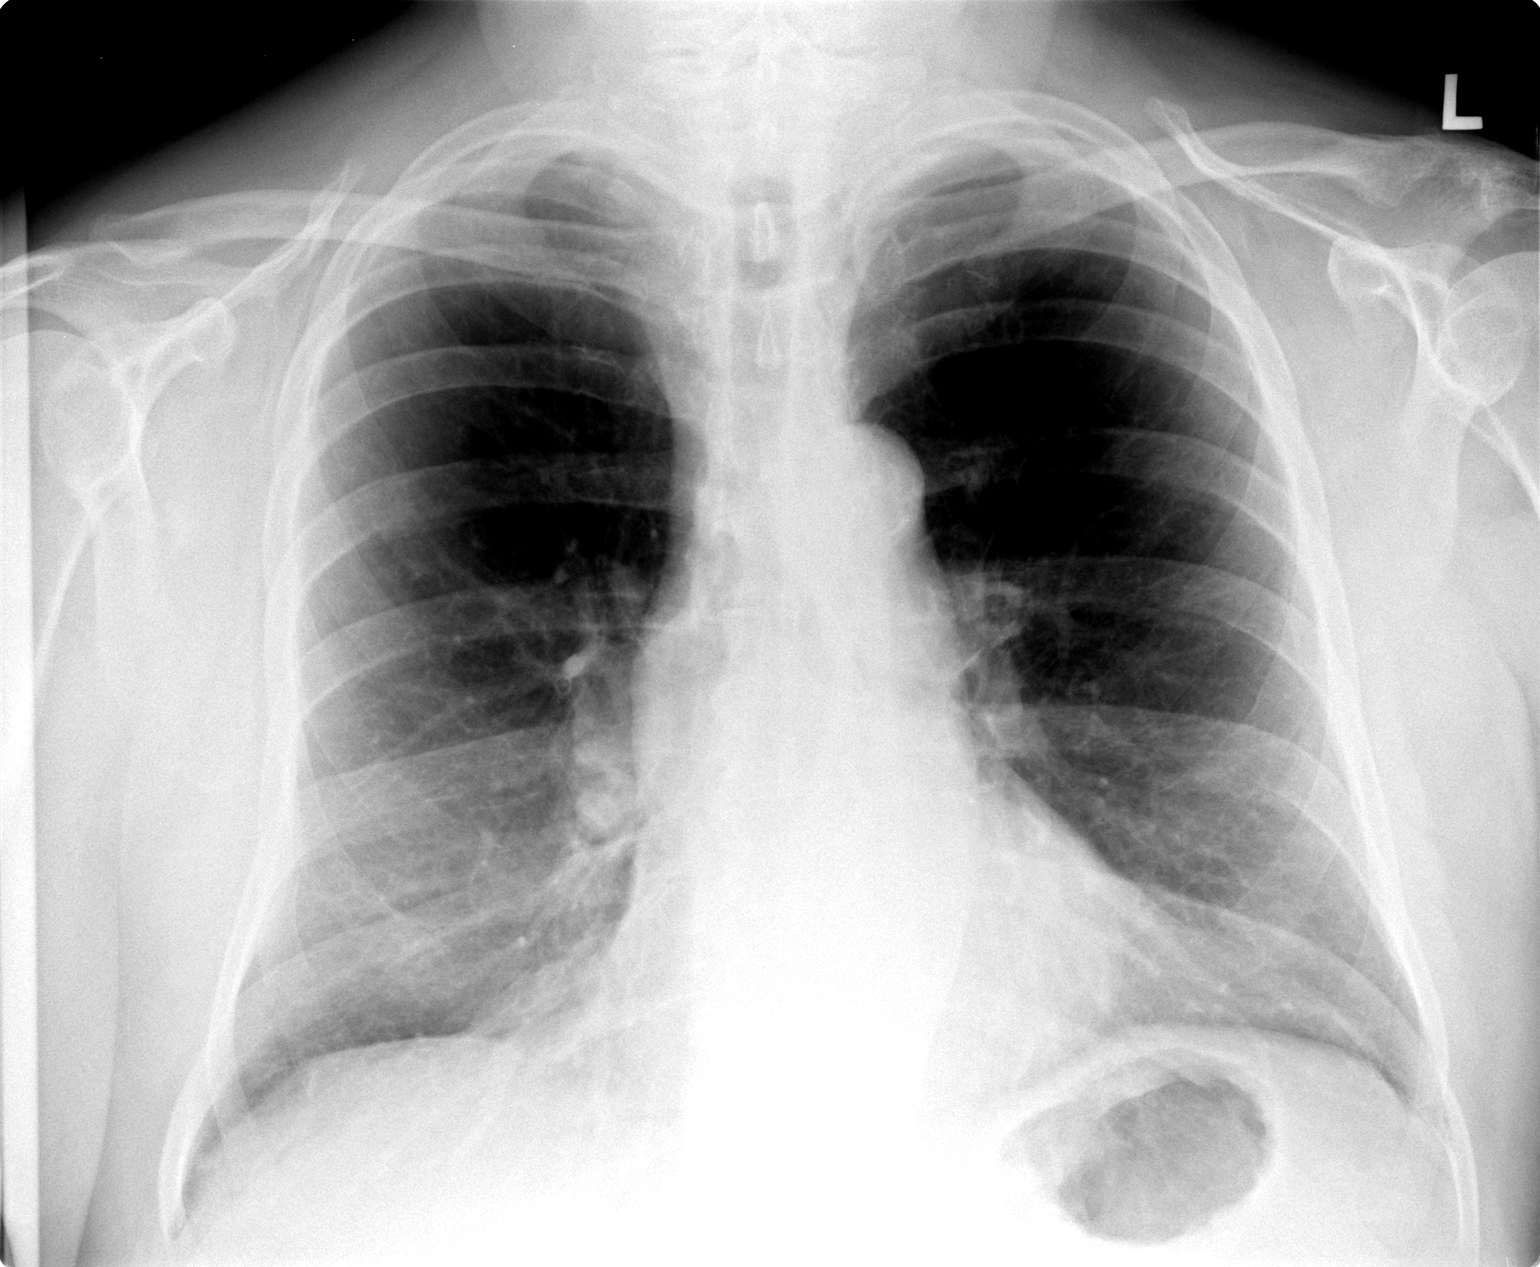

[view not recorded (2 of 2)]
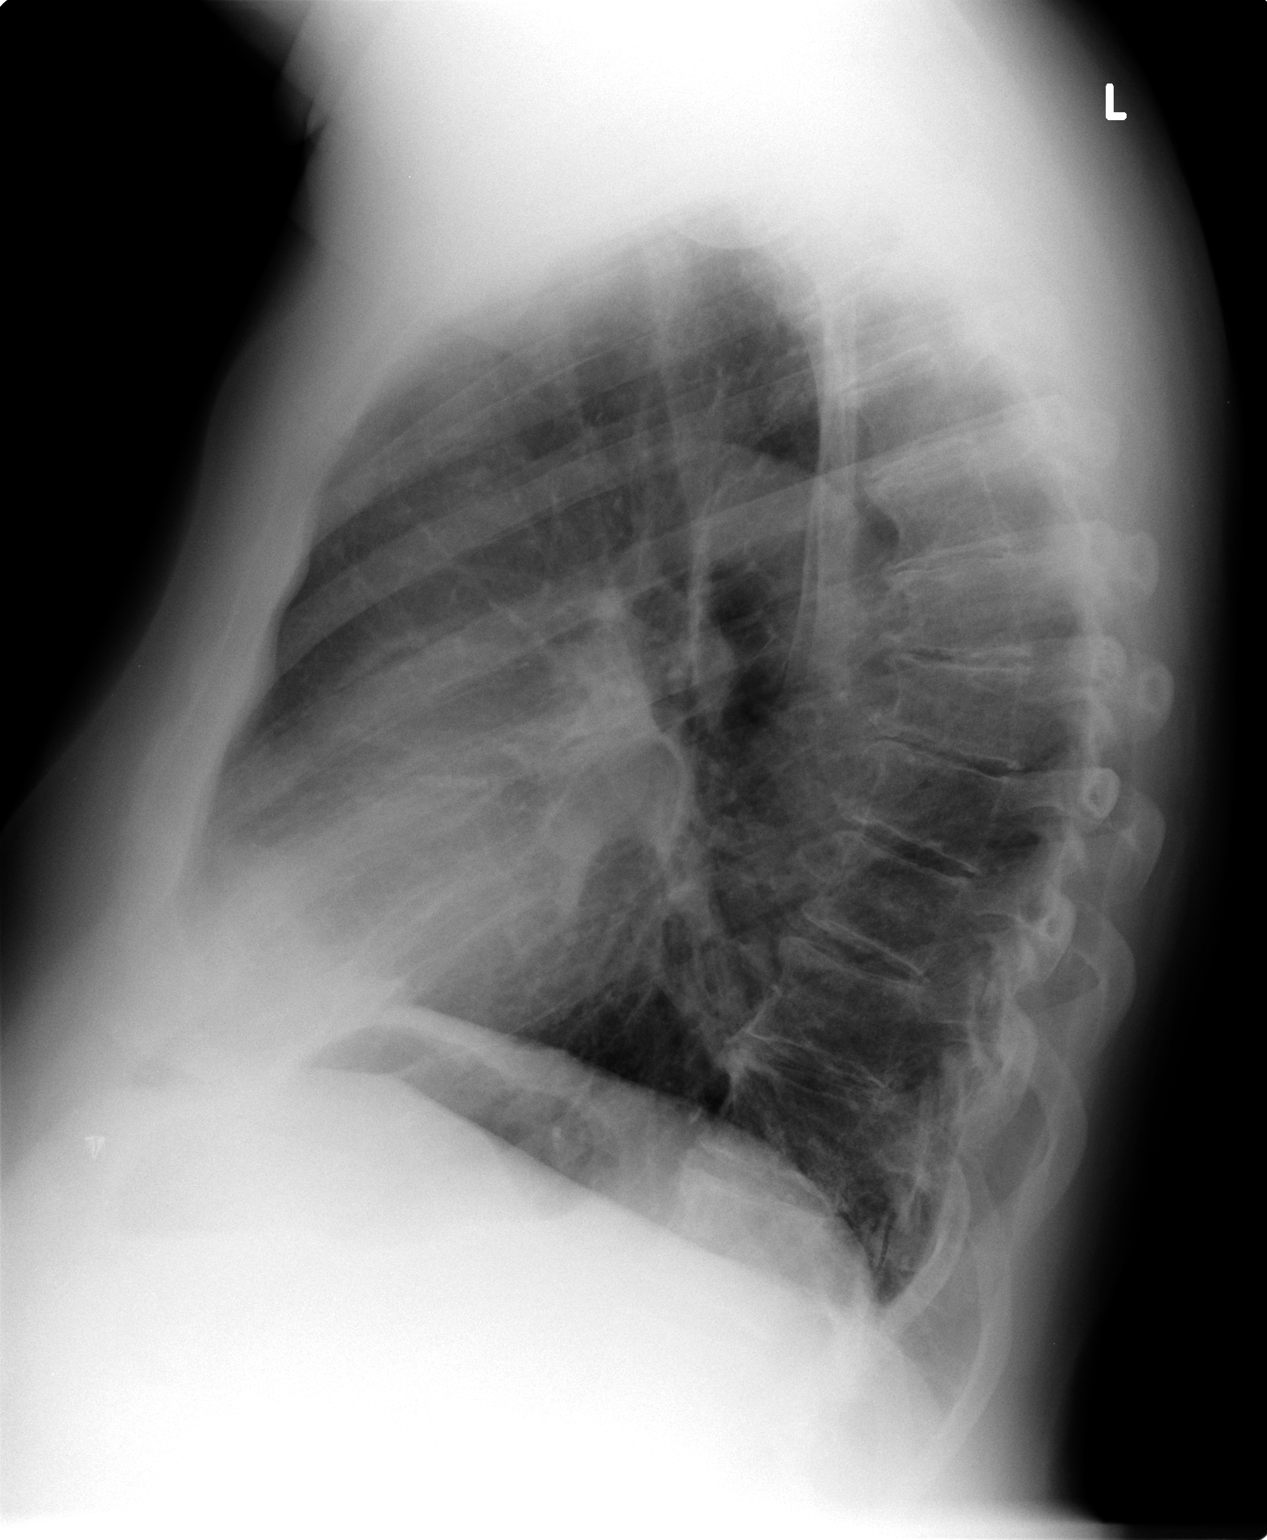

[2 of 2 positions shown; findings below may reference images not displayed]

FINDINGS: The lungs are adequately inflated and clear. The heart and pulmonary
vascularity are normal. The mediastinum is normal in width. There is
no pleural effusion. There is mild widening of the AC joints
bilaterally which appears chronic. The thoracic vertebral bodies are
preserved in height.
IMPRESSION: Mild hyperinflation may reflect COPD. There is no active
cardiopulmonary disease.

## 2017-06-05 ENCOUNTER — Other Ambulatory Visit: Payer: Self-pay | Admitting: Nurse Practitioner

## 2017-06-05 ENCOUNTER — Ambulatory Visit
Admission: RE | Admit: 2017-06-05 | Discharge: 2017-06-05 | Disposition: A | Discharge status: Home / Self Care | Payer: Medicare HMO | Source: Ambulatory Visit | Attending: Nurse Practitioner | Admitting: Nurse Practitioner

## 2017-06-05 DIAGNOSIS — R0600 Dyspnea, unspecified: Secondary | ICD-10-CM

## 2017-06-05 DIAGNOSIS — R0609 Other forms of dyspnea: Secondary | ICD-10-CM

## 2017-07-02 DIAGNOSIS — N289 Disorder of kidney and ureter, unspecified: Secondary | ICD-10-CM | POA: Insufficient documentation

## 2017-07-02 DIAGNOSIS — R7989 Other specified abnormal findings of blood chemistry: Secondary | ICD-10-CM | POA: Insufficient documentation

## 2017-07-02 DIAGNOSIS — R945 Abnormal results of liver function studies: Secondary | ICD-10-CM | POA: Insufficient documentation

## 2017-11-15 ENCOUNTER — Observation Stay (HOSPITAL_BASED_OUTPATIENT_CLINIC_OR_DEPARTMENT_OTHER): Payer: MEDICARE

## 2017-11-15 ENCOUNTER — Encounter (HOSPITAL_COMMUNITY): Payer: Self-pay

## 2017-11-15 ENCOUNTER — Other Ambulatory Visit: Payer: Self-pay

## 2017-11-15 ENCOUNTER — Inpatient Hospital Stay (HOSPITAL_COMMUNITY)
Admission: EM | Admit: 2017-11-15 | Discharge: 2017-11-16 | DRG: 312 | Disposition: A | Discharge status: Home / Self Care | Payer: MEDICARE | Attending: Family Medicine | Admitting: Family Medicine

## 2017-11-15 DIAGNOSIS — Z87891 Personal history of nicotine dependence: Secondary | ICD-10-CM | POA: Diagnosis not present

## 2017-11-15 DIAGNOSIS — M48061 Spinal stenosis, lumbar region without neurogenic claudication: Secondary | ICD-10-CM | POA: Diagnosis present

## 2017-11-15 DIAGNOSIS — G629 Polyneuropathy, unspecified: Secondary | ICD-10-CM | POA: Diagnosis present

## 2017-11-15 DIAGNOSIS — I951 Orthostatic hypotension: Principal | ICD-10-CM | POA: Diagnosis present

## 2017-11-15 DIAGNOSIS — K5010 Crohn's disease of large intestine without complications: Secondary | ICD-10-CM | POA: Diagnosis not present

## 2017-11-15 DIAGNOSIS — E785 Hyperlipidemia, unspecified: Secondary | ICD-10-CM | POA: Diagnosis present

## 2017-11-15 DIAGNOSIS — Z833 Family history of diabetes mellitus: Secondary | ICD-10-CM

## 2017-11-15 DIAGNOSIS — R402362 Coma scale, best motor response, obeys commands, at arrival to emergency department: Secondary | ICD-10-CM | POA: Diagnosis present

## 2017-11-15 DIAGNOSIS — N3281 Overactive bladder: Secondary | ICD-10-CM | POA: Diagnosis present

## 2017-11-15 DIAGNOSIS — N529 Male erectile dysfunction, unspecified: Secondary | ICD-10-CM | POA: Diagnosis not present

## 2017-11-15 DIAGNOSIS — I5030 Unspecified diastolic (congestive) heart failure: Secondary | ICD-10-CM | POA: Diagnosis not present

## 2017-11-15 DIAGNOSIS — G609 Hereditary and idiopathic neuropathy, unspecified: Secondary | ICD-10-CM | POA: Diagnosis not present

## 2017-11-15 DIAGNOSIS — R7303 Prediabetes: Secondary | ICD-10-CM | POA: Diagnosis not present

## 2017-11-15 DIAGNOSIS — R55 Syncope and collapse: Secondary | ICD-10-CM | POA: Diagnosis present

## 2017-11-15 DIAGNOSIS — N179 Acute kidney failure, unspecified: Secondary | ICD-10-CM | POA: Diagnosis present

## 2017-11-15 DIAGNOSIS — I071 Rheumatic tricuspid insufficiency: Secondary | ICD-10-CM | POA: Diagnosis present

## 2017-11-15 DIAGNOSIS — I700 Atherosclerosis of aorta: Secondary | ICD-10-CM | POA: Diagnosis present

## 2017-11-15 DIAGNOSIS — Z79899 Other long term (current) drug therapy: Secondary | ICD-10-CM

## 2017-11-15 DIAGNOSIS — Z87440 Personal history of urinary (tract) infections: Secondary | ICD-10-CM

## 2017-11-15 DIAGNOSIS — R402142 Coma scale, eyes open, spontaneous, at arrival to emergency department: Secondary | ICD-10-CM | POA: Diagnosis present

## 2017-11-15 DIAGNOSIS — Z820 Family history of epilepsy and other diseases of the nervous system: Secondary | ICD-10-CM

## 2017-11-15 DIAGNOSIS — N401 Enlarged prostate with lower urinary tract symptoms: Secondary | ICD-10-CM | POA: Diagnosis not present

## 2017-11-15 DIAGNOSIS — E860 Dehydration: Secondary | ICD-10-CM | POA: Diagnosis present

## 2017-11-15 DIAGNOSIS — Z888 Allergy status to other drugs, medicaments and biological substances status: Secondary | ICD-10-CM

## 2017-11-15 DIAGNOSIS — I739 Peripheral vascular disease, unspecified: Secondary | ICD-10-CM | POA: Diagnosis not present

## 2017-11-15 DIAGNOSIS — R402252 Coma scale, best verbal response, oriented, at arrival to emergency department: Secondary | ICD-10-CM | POA: Diagnosis present

## 2017-11-15 DIAGNOSIS — Z886 Allergy status to analgesic agent status: Secondary | ICD-10-CM

## 2017-11-15 LAB — CBC WITH DIFFERENTIAL/PLATELET
Abs Immature Granulocytes: 0.04 10*3/uL (ref 0.00–0.07)
BASOS ABS: 0.1 10*3/uL (ref 0.0–0.1)
Basophils Relative: 1 %
EOS ABS: 0.2 10*3/uL (ref 0.0–0.5)
EOS PCT: 2 %
HEMATOCRIT: 41.7 % (ref 39.0–52.0)
HEMOGLOBIN: 13.6 g/dL (ref 13.0–17.0)
Immature Granulocytes: 0 %
Lymphocytes Relative: 21 %
Lymphs Abs: 1.9 10*3/uL (ref 0.7–4.0)
MCH: 32 pg (ref 26.0–34.0)
MCHC: 32.6 g/dL (ref 30.0–36.0)
MCV: 98.1 fL (ref 80.0–100.0)
MONOS PCT: 12 %
Monocytes Absolute: 1.1 10*3/uL — ABNORMAL HIGH (ref 0.1–1.0)
NEUTROS PCT: 64 %
NRBC: 0 % (ref 0.0–0.2)
Neutro Abs: 5.8 10*3/uL (ref 1.7–7.7)
Platelets: 263 10*3/uL (ref 150–400)
RBC: 4.25 MIL/uL (ref 4.22–5.81)
RDW: 13.2 % (ref 11.5–15.5)
WBC: 9.1 10*3/uL (ref 4.0–10.5)

## 2017-11-15 LAB — COMPREHENSIVE METABOLIC PANEL
ALBUMIN: 3.9 g/dL (ref 3.5–5.0)
ALK PHOS: 46 U/L (ref 38–126)
ALT: 20 U/L (ref 0–44)
ANION GAP: 9 (ref 5–15)
AST: 20 U/L (ref 15–41)
BILIRUBIN TOTAL: 0.8 mg/dL (ref 0.3–1.2)
BUN: 17 mg/dL (ref 8–23)
CALCIUM: 9.2 mg/dL (ref 8.9–10.3)
CO2: 25 mmol/L (ref 22–32)
Chloride: 103 mmol/L (ref 98–111)
Creatinine, Ser: 1.25 mg/dL — ABNORMAL HIGH (ref 0.61–1.24)
GFR calc Af Amer: >60 mL/min (ref >60)
GFR calc non Af Amer: 52 mL/min — ABNORMAL LOW (ref >60)
GLUCOSE: 131 mg/dL — AB (ref 70–99)
Potassium: 4.4 mmol/L (ref 3.5–5.1)
Sodium: 137 mmol/L (ref 135–145)
TOTAL PROTEIN: 6.8 g/dL (ref 6.5–8.1)

## 2017-11-15 LAB — HEMOGLOBIN A1C
HEMOGLOBIN A1C: 6.1 % — AB (ref 4.8–5.6)
MEAN PLASMA GLUCOSE: 128.37 mg/dL

## 2017-11-15 LAB — LIPID PANEL
CHOL/HDL RATIO: 5.4 ratio
Cholesterol: 177 mg/dL (ref 0–200)
HDL: 33 mg/dL — ABNORMAL LOW (ref >40)
LDL Cholesterol: 116 mg/dL — ABNORMAL HIGH (ref 0–99)
Triglycerides: 141 mg/dL (ref <150)
VLDL: 28 mg/dL (ref 0–40)

## 2017-11-15 LAB — TSH: TSH: 3.193 u[IU]/mL (ref 0.350–4.500)

## 2017-11-15 LAB — CBG MONITORING, ED: GLUCOSE-CAPILLARY: 116 mg/dL — AB (ref 70–99)

## 2017-11-15 LAB — CARBAMAZEPINE LEVEL, TOTAL

## 2017-11-15 MED ORDER — OXYBUTYNIN CHLORIDE ER 10 MG PO TB24
10.0000 mg | ORAL_TABLET | Freq: Every day | ORAL | Status: DC
  Administered 2017-11-15: 10 mg via ORAL
  Filled 2017-11-15: qty 1

## 2017-11-15 MED ORDER — DULOXETINE HCL 60 MG PO CPEP: 60.0000 mg | ORAL_CAPSULE | Freq: Every day | ORAL | Status: DC

## 2017-11-15 MED ORDER — ACETAMINOPHEN 500 MG PO TABS
500.0000 mg | ORAL_TABLET | Freq: Four times a day (QID) | ORAL | Status: DC | PRN
  Administered 2017-11-15 – 2017-11-16 (×2): 500 mg via ORAL
  Filled 2017-11-15 (×2): qty 1

## 2017-11-15 MED ORDER — PRAVASTATIN SODIUM 40 MG PO TABS
40.0000 mg | ORAL_TABLET | Freq: Every day | ORAL | Status: DC
  Administered 2017-11-15: 40 mg via ORAL
  Filled 2017-11-15: qty 1

## 2017-11-15 MED ORDER — ENOXAPARIN SODIUM 40 MG/0.4ML ~~LOC~~ SOLN
40.0000 mg | SUBCUTANEOUS | Status: DC
  Filled 2017-11-15: qty 0.4

## 2017-11-15 MED ORDER — FINASTERIDE 5 MG PO TABS
5.0000 mg | ORAL_TABLET | Freq: Every day | ORAL | Status: DC
  Administered 2017-11-16: 5 mg via ORAL
  Filled 2017-11-15: qty 1

## 2017-11-15 MED ORDER — SODIUM CHLORIDE 0.9 % IV BOLUS
500.0000 mL | Freq: Once | INTRAVENOUS | Status: AC
  Administered 2017-11-15: 500 mL via INTRAVENOUS

## 2017-11-15 NOTE — Progress Notes (Signed)
Family Medicine Teaching Service Daily Progress Note Intern Pager: [phone]  Patient name: Edward English Medical record number: [id number] Date of birth: 08/26/1933 Age: 84 y.o. Gender: male  Primary Care Provider: Patient, No Pcp Per Consultants: none Code Status: Full  Pt Overview and Major Events to Date:  11/9 admitted for syncope  Assessment and Plan: Edward English is a 84 y.o. male who presented after a witnessed syncopal episode. PMH is significant for hyperlipidemia, prediabetes, former smoker, overactive bladder, BPH, lumbar spinal stenosis, and peripheral neuropathy.  Syncopal Episode, resolved. Positive orthostatics on admission so likely secondary to orthostatic hypotension. Also could have been vasovagal given his history and presentation  - monitor on telemetry - ECHO results pending - AM EKG   AKI, resolved: Cr improved to 1.02 today -Encourage good p.o. intake  Prediabetes: a1c 6.1, no longer on home metformin - follow up outpatient  HLD: stable. LDL 116 -continue home Pravastatin 40 mg daily  Peripheral neuropathy:stable.  -Monitor while inpatient -holding home oxcarbazepine   Overactive Bladder: takes Tolterodine 2mg  BID at home - Oxybutynin 10mg  XR once daily while admitted  BPH, stable. - continue home finasteride 5mg  PO daily  FEN/GI: regular  PPx: lovenox  Disposition: home today  Subjective:  Patient states that he feels well today, no complaints. Did not get lightheaded or dizzy with standing. No CP or palpitations. No presyncope or syncope since admit.  Objective: Temp:  [97.5 F (36.4 C)-98.1 F (36.7 C)] 97.6 F (36.4 C) (11/10 0524) Pulse Rate:  [65-101] 66 (11/10 0524) Resp:  [13-20] 18 (11/10 0524) BP: (107-163)/(44-95) 114/64 (11/10 0524) SpO2:  [81 %-100 %] 100 % (11/10 0524) Weight:  [86.2 kg-87.2 kg] 86.5 kg (11/10 0524) Physical Exam: General: laying comfortably in bed, in NAD Cardiovascular: RRR, no  murmurs Respiratory: CTAB, NWOB Abdomen: soft, nontender, nondistended, + bowel sounds Extremities:  Warm and well perfused, no edema  Laboratory: Recent Labs  Lab 11/15/17 1253 11/16/17 0439  WBC 9.1 7.8  HGB 13.6 12.3*  HCT 41.7 37.2*  PLT 263 213   Recent Labs  Lab 11/15/17 1253 11/16/17 0439  NA 137 139  K 4.4 3.9  CL 103 109  CO2 25 24  BUN 17 15  CREATININE 1.25* 1.02  CALCIUM 9.2 8.8*  PROT 6.8  --   BILITOT 0.8  --   ALKPHOS 46  --   ALT 20  --   AST 20  --   GLUCOSE 131* 130*     Imaging/Diagnostic Tests: No results found.  Leland Her, DO 11/16/2017, 8:17 AM PGY-3, [facility] FPTS Intern pager: [phone], text pages welcome

## 2017-11-15 NOTE — H&P (Addendum)
Family Medicine Teaching [facility] Admission History and Physical Service Pager: [phone]  Patient name: Edward English Medical record number: [id number] Date of birth: 12/03/1933 Age: 84 y.o. Gender: male  Primary Care Provider: Patient, No Pcp Per Consultants: None Code Status: Full  Chief Complaint: Syncopal episode  Assessment and Plan: Edward English is a 84 y.o. male presenting with syncopal episode. PMH is significant for hyperlipidemia, prediabetes, former smoker, overactive bladder, BPH, lumbar spinal stenosis, and peripheral neuropathy.  Syncopal Episode: The patient was volunteering in a warm kitchen when he reports he began to feel dizzy and soon after lost consciousness.  Patient did not hit his head in the process.  This was a witnessed event.  In the emergency department he was bolused 500 cc normal saline.  Blood pressure was 114/72 on admission and blood sugar 131.  Creatinine was 1.25, elevated from the most recent creatinine on his chart, which was 0.82 in June 2016, indicating that patient may have been dehydrated.  EKG shows sinus rhythm with borderline T wave abnormalities. Differential includes vasovagal syncope (patient has history of such) and orthostatic hypotension (patient had been standing for prolonged period of time). Dehydration in the warm environment most likely contributed.  Episode was most likely not a seizure due to no report of tremors, tongue biting, or loss of bowel function. A cardiac event is an unlikely etiology on admission as EKG is without signs of arrhythmia, there is no history of heart palpitations, and no known structural heart disease; however, given patient's age, he is being admitted for work-up of any possible cardiac cause, including aortic stenosis or arrhythmia.  TIA is a possibility, but is less likely due to his lack of focal neurologic deficits during or around the syncopal event.  Patient does not take many medications and takes  no blood pressure or cholinergics, but it is possible that his oxcarbazepine could increase the likelihood for imbalance and dizziness. - Admit to telemetry, attending Dr. McDiarmid - Continuous cardiac monitoring - Vitals per protocol - Tylenol PRN Pain - Up with assistance - Risk stratification labs: HbA1c, TSH - ECHO - AM EKG - Orthostatic vitals  AKI: Cr on arrival in the ED is 1.25.  Baseline appears to be about 0.8.  BUN within normal limits at 17 and GFR greater than 60. -Avoid nephrotoxic medications -Encourage good p.o. intake -A.m. BMP  Prediabetes: Most recent HbA1c in June 2016 was 5.9%, but has been as high as 6.4.  Patient taking metformin 500 mg daily at home until 2 months ago.  He stopped this medication because he did not want to take too many medications. -Hold metformin  HLD: Last lipid panel in October 2016 shows cholesterol elevated at 219, triglycerides 200, HDL 29 LDL 150.  Patient prescribed lovastatin 20 mg daily. -Repeat lipid panel -Pravastatin 40 mg daily  Peripheral neuropathy:Patient has a history of peripheral neuropathy as well as sciatic nerve pain and lumbar stenosis.  He denies any recent injuries or worsening of the peripheral neuropathy.  He is taking oxcarbazepine at home for this condition. -Monitor while inpatient -No meds at this time given possible side effects worsening his risk for syncope  Overactive Bladder: takes Tolterodine 2mg  BID at home.  Since this is an antimuscarinic rather than an anticholinergic, this would be unlikely to contribute to syncope. - Oxybutynin 10mg  XR once daily  BPH: patient has a history of BPH for which he is currently taking finasteride 5mg  daily.  - Finasteride 5mg  PO  daily  FEN/GI: Saline locked, regular diet Prophylaxis: Lovenox  Disposition: Admit to telemetry  History of Present Illness:  Edward English is a 84 y.o. male presenting with syncope.  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 He went over to an open window looking for fresh air, but his legs gave out and he lost consciousness.  He did not hit his head.  He lost bladder control but not bowel control.  No one saw him shaking, but he was told that he was clammy.  He says he had some cereal this morning but had not eaten or drunk anything since breakfast.  This occurred one year ago also while he was cooking for [employer], but he did not have urinary incontinence at that time.  He was told that he was dehydrated and got overheated at that time.  He has also been told that he has had vasovagal syncope and orthostatic hypotension.  The first time this occurred was about two years ago and this occurred when he stood up from the bed and was walking to the bathroom.    The patient had a stress test performed a few years ago after one of these syncopal events and it was overall normal per his report.  He took detrol, finasteride, lovastatin, and oxcarbazepine this morning, which are his normal home medications. He is not currently taking metformin or cymbalta.    Review Of Systems: Per HPI with the following additions: Review of Systems  Constitutional: Negative for fever and malaise/fatigue.  Eyes: Negative for blurred vision.  Respiratory: Negative for shortness of breath.   Cardiovascular: Negative for chest pain and palpitations.  Gastrointestinal: Negative for constipation, diarrhea, nausea and vomiting.  Genitourinary: Positive for urgency.  Neurological: Positive for dizziness and loss of consciousness. Negative for seizures, weakness and headaches.  Psychiatric/Behavioral: The patient is not nervous/anxious.     Patient Active Problem List   Diagnosis Date Noted  . Syncope 11/15/2017  . Hereditary and idiopathic peripheral neuropathy 04/26/2015  . Right-sided low back pain with right-sided sciatica 04/26/2015  . Arthritis of right  lower extremity 05/04/2014  . Low serum testosterone level 05/23/2013  . Gynecomastia, male 05/14/2013  . Routine health maintenance 06/30/2011  . TOBACCO ABUSE, HX OF 12/26/2009  . REGIONAL ENTERITIS OF LARGE INTESTINE 06/23/2008  . Hyperlipidemia 02/17/2007  . Spinal stenosis of lumbar region 02/17/2007  . OVERACTIVE BLADDER 02/17/2007  . Elevated blood sugar 02/17/2007    Past Medical History: Past Medical History:  Diagnosis Date  . BENIGN PROSTATIC HYPERTROPHY, HX OF 02/17/2007   Qualifier: Diagnosis of  By: Genelle Gather CMA, [place]    . DIVERTICULITIS-COLON 02/04/2008   Qualifier: Diagnosis of  By: Jarold Motto MD [place] ERECTILE DYSFUNCTION, MILD 02/17/2007   Qualifier: Diagnosis of  By: Genelle Gather CMA, [place]    . HYPERGLYCEMIA 02/17/2007   Qualifier: Diagnosis of  By: Genelle Gather CMA, [place]    . HYPERLIPIDEMIA 02/17/2007   Qualifier: Diagnosis of  By: Genelle Gather CMA, [place]    . HYPOGONADISM 02/17/2007   Qualifier: Diagnosis of  By: Genelle Gather CMA, [place]    . OVERACTIVE BLADDER 02/17/2007   Qualifier: Diagnosis of  By: Genelle Gather CMA, [place]    . PERIPHERAL NEUROPATHY 02/17/2007   Qualifier: Diagnosis of  By: Genelle Gather CMA, [place]    . PERIPHERAL VASCULAR DISEASE 02/17/2007   Qualifier: Diagnosis of  By: Genelle Gather CMA, [place]    . Regional enteritis of large intestine ([facility]) 06/23/2008   Qualifier: Diagnosis of  By: Jarold Motto MD [place] TOBACCO ABUSE, HX OF 12/26/2009   Qualifier: Diagnosis of  By: Bullins CMA, [place]    . TONSILLECTOMY, HX OF 02/17/2007   Qualifier: Diagnosis of  By: Genelle Gather CMA, [place]      Past Surgical History: Past Surgical History:  Procedure Laterality Date  . cervical hemilaminectomy     C5-6, C6-7  . dupuytrens contracture relief    . Ectropion correction  2010   Left lower eye lid  . SEPTOPLASTY    . SHOULDER ARTHROSCOPY    . TONSILLECTOMY      Social History: Social History   Tobacco Use  . Smoking status: Former Smoker    Last attempt to  quit: 11/22/1988    Years since quitting: 29.0  . Smokeless tobacco: Never Used  Substance Use Topics  . Alcohol use: Yes    Alcohol/week: 0.0 standard drinks    Comment: 1 beer/month  . Drug use: No   Additional social history:   Please also refer to relevant sections of EMR.  Family History: Family History  Problem Relation Age of Onset  . Diabetes Mother   . Alzheimer's disease Father   . Colon cancer Neg Hx   . Prostate cancer Neg Hx   . Stroke Neg Hx   . Coronary artery disease Neg Hx    (If not completed, MUST add something in)  Allergies and Medications: Allergies  Allergen Reactions  . Valdecoxib Rash   No current facility-administered medications on file prior to encounter.    Current Outpatient Medications on File Prior to Encounter  Medication Sig Dispense Refill  . acetaminophen (TYLENOL) 500 MG tablet Take 500 mg by mouth every 6 (six) hours as needed.    . DULoxetine (CYMBALTA) 60 MG capsule Take 60 mg by mouth daily.    Marland Kitchen tolterodine (DETROL) 2 MG tablet Take 2 mg by mouth 2 (two) times daily.      Objective: BP (!) 163/84 (BP Location: Left Arm)   Pulse 89   Temp 97.6 F (36.4 C) (Oral)   Resp 18   Ht 5\' 9"  (1.753 m)   Wt 87.2 kg Comment: scale A  SpO2 92%   BMI 28.38 kg/m   Physical Exam  Constitutional: He is oriented to person, place, and time. He appears well-developed and well-nourished. No distress.  HENT:  Head: Normocephalic and atraumatic.  Nose: Nose normal.  Mouth/Throat: Oropharynx is clear and moist. No oropharyngeal exudate.  Moist mucus membranes  Eyes: EOM are normal.  Neck: Normal range of motion. No JVD present.  Cardiovascular: Normal rate, regular rhythm and normal heart sounds.  Pulmonary/Chest: Effort normal and breath sounds normal. No respiratory distress.  Abdominal: Soft. There is no tenderness.  Musculoskeletal: Normal range of motion. He exhibits edema (trace pitting to bilateral lower extremities).   Neurological: He is alert and oriented to person, place, and time. No cranial nerve deficit or sensory deficit. He exhibits normal muscle tone. Coordination normal.  Skin: Skin is warm and dry. He is not diaphoretic.  Psychiatric: He has a normal mood and affect. His behavior is normal. Judgment and thought content normal.   Labs and Imaging: CBC BMET  Recent Labs  Lab 11/15/17 1253  WBC 9.1  HGB 13.6  HCT 41.7  PLT 263   Recent Labs  Lab 11/15/17 1253  NA  137  K 4.4  CL 103  CO2 25  BUN 17  CREATININE 1.25*  GLUCOSE 131*  CALCIUM 9.2      Dollene Cleveland, DO 11/15/2017, 4:01 PM PGY-1, [facility] FPTS Intern pager: [phone], text pages welcome  FPTS Upper-Level Resident Addendum   I have independently interviewed and examined the patient. I have discussed the above with the original author and agree with their documentation. My edits for correction/addition/clarification are in green. Please see also any attending notes.    Lennox Solders, MD PGY-2, [facility] 11/15/2017 4:31 PM  FPTS Service pager: [phone] (text pages welcome through [facility])

## 2017-11-15 NOTE — Plan of Care (Signed)
  Problem: Health Behavior/Discharge Planning: Goal: Ability to manage health-related needs will improve Outcome: Progressing   Problem: Clinical Measurements: Goal: Will remain free from infection Outcome: Progressing   Problem: Pain Managment: Goal: General experience of comfort will improve Outcome: Progressing   Problem: Safety: Goal: Ability to remain free from injury will improve Outcome: Progressing   Problem: Skin Integrity: Goal: Risk for impaired skin integrity will decrease Outcome: Progressing   

## 2017-11-15 NOTE — ED Triage Notes (Signed)
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 Pt was sitting in a chair at the time and did not fall per EMS. Pt was easily arousable upon EMS arrival. Pt initially complained of some dizziness. Pt was incontinent of urine. Pt states this has happened several times in the past.

## 2017-11-15 NOTE — Progress Notes (Signed)
Orthostatic VS ordered and completed.

## 2017-11-15 NOTE — ED Provider Notes (Signed)
[facility] EMERGENCY DEPARTMENT Provider Note   CSN: [id number] Arrival date & time: 11/15/17  1246     History   Chief Complaint Chief Complaint  Patient presents with  . Loss of Consciousness    HPI Edward English is a 84 y.o. male.  The history is provided by the patient and medical records. No language interpreter was used.  Loss of Consciousness   Associated symptoms include weakness.   Edward English is a 84 y.o. male  with a PMH as listed below who presents to the Emergency Department for evaluation after syncopal episode just prior to arrival.  Patient states that he was cooking at the [employer] when he had a witnessed syncopal episode.  Patient states that he began feeling flushed and felt as if he may pass out.  He went to sit down and syncopal episode occurred while sitting in a chair.  He did not fall or hit his head.  He is not on any anticoagulation.  Patient did have an episode of urinary incontinence.  Episode lasted about 3 minutes.  He did not have any confusion following return of consciousness per EMS.  Bystanders denied any shaking or seizure-like activity.  Patient does state that he has had a similar experience about 5 times in the past and was told this was due to blood rushing to his head quickly or getting overheated.  He does not feel like he has ever had urinary incontinence with it before, but he is unsure.  He never had any chest pain or shortness of breath.  He currently denies any symptoms.   Past Medical History:  Diagnosis Date  . BENIGN PROSTATIC HYPERTROPHY, HX OF 02/17/2007   Qualifier: Diagnosis of  By: [place], [place]    . DIVERTICULITIS-COLON 02/04/2008   Qualifier: Diagnosis of  By: Sharlett Iles MD Byrd Hesselbach ERECTILE DYSFUNCTION, MILD 02/17/2007   Qualifier: Diagnosis of  By: [place], [place]    . HYPERGLYCEMIA 02/17/2007   Qualifier: Diagnosis of  By: Danny Lawless CMA, [place]    . HYPERLIPIDEMIA 02/17/2007     Qualifier: Diagnosis of  By: Danny Lawless CMA, [place]    . HYPOGONADISM 02/17/2007   Qualifier: Diagnosis of  By: Danny Lawless CMA, [place]    . OVERACTIVE BLADDER 02/17/2007   Qualifier: Diagnosis of  By: Danny Lawless CMA, [place]    . PERIPHERAL NEUROPATHY 02/17/2007   Qualifier: Diagnosis of  By: [place], [place]    . PERIPHERAL VASCULAR DISEASE 02/17/2007   Qualifier: Diagnosis of  By: [place], [place]    . Regional enteritis of large intestine ([place]) 06/23/2008   Qualifier: Diagnosis of  By: Sharlett Iles MD FACG, [place], HX OF 12/26/2009   Qualifier: Diagnosis of  By: Bullins CMA, [place]    . TONSILLECTOMY, HX OF 02/17/2007   Qualifier: Diagnosis of  By: Danny Lawless CMA, [place]      Patient Active Problem List   Diagnosis Date Noted  . Syncope 11/15/2017  . Hereditary and idiopathic peripheral neuropathy 04/26/2015  . Right-sided low back pain with right-sided sciatica 04/26/2015  . Arthritis of right lower extremity 05/04/2014  . Low serum testosterone level 05/23/2013  . Gynecomastia, male 05/14/2013  . Routine health maintenance 06/30/2011  . TOBACCO ABUSE, HX OF 12/26/2009  . REGIONAL ENTERITIS OF LARGE INTESTINE 06/23/2008  . Hyperlipidemia 02/17/2007  . Spinal stenosis of lumbar region 02/17/2007  . OVERACTIVE BLADDER 02/17/2007  . Elevated blood sugar  02/17/2007    Past Surgical History:  Procedure Laterality Date  . cervical hemilaminectomy     C5-6, C6-7  . dupuytrens contracture relief    . Ectropion correction  2010   Left lower eye lid  . SEPTOPLASTY    . SHOULDER ARTHROSCOPY    . TONSILLECTOMY          Home Medications    Prior to Admission medications   Medication Sig Start Date End Date Taking? Authorizing Provider  acetaminophen (TYLENOL) 500 MG tablet Take 500 mg by mouth every 6 (six) hours as needed.    [provider]  DULoxetine (CYMBALTA) 60 MG capsule Take 60 mg by mouth daily.    [provider]  tolterodine (DETROL) 2 MG  tablet Take 2 mg by mouth 2 (two) times daily.    [provider]    Family History Family History  Problem Relation Age of Onset  . Diabetes Mother   . Alzheimer's disease Father   . Colon cancer Neg Hx   . Prostate cancer Neg Hx   . Stroke Neg Hx   . Coronary artery disease Neg Hx     Social History Social History   Tobacco Use  . Smoking status: Former Smoker    Last attempt to quit: 11/22/1988    Years since quitting: 29.0  . Smokeless tobacco: Never Used  Substance Use Topics  . Alcohol use: Yes    Alcohol/week: 0.0 standard drinks    Comment: 1 beer/month  . Drug use: No     Allergies   Valdecoxib   Review of Systems Review of Systems  Cardiovascular: Positive for syncope.  Neurological: Positive for syncope and weakness.  All other systems reviewed and are negative.    Physical Exam Updated Vital Signs BP 114/72   Pulse 69   Temp 97.6 F (36.4 C) (Oral)   Resp 16   Ht 5' 9"  (1.753 m)   Wt 86.2 kg   SpO2 96%   BMI 28.06 kg/m   Physical Exam  Constitutional: He is oriented to person, place, and time. He appears well-developed and well-nourished. No distress.  HENT:  Head: Normocephalic and atraumatic.  Cardiovascular: Normal rate, regular rhythm and normal heart sounds.  No murmur heard. Pulmonary/Chest: Effort normal and breath sounds normal. No respiratory distress.  Abdominal: Soft. He exhibits no distension. There is no tenderness.  Musculoskeletal: Normal range of motion.  Neurological: He is alert and oriented to person, place, and time.  Speech clear and goal oriented. CN 2-12 grossly intact. Normal finger-to-nose and rapid alternating movements. No drift. Strength and sensation intact.  Skin: Skin is warm and dry.  Nursing note and vitals reviewed.    ED Treatments / Results  Labs (all labs ordered are listed, but only abnormal results are displayed) Labs Reviewed  CBC WITH DIFFERENTIAL/PLATELET - Abnormal; Notable for  the following components:      Result Value   Monocytes Absolute 1.1 (*)    All other components within normal limits  COMPREHENSIVE METABOLIC PANEL - Abnormal; Notable for the following components:   Glucose, Bld 131 (*)    Creatinine, Ser 1.25 (*)    GFR calc non Af Amer 52 (*)    All other components within normal limits  CBG MONITORING, ED - Abnormal; Notable for the following components:   Glucose-Capillary 116 (*)    All other components within normal limits    EKG EKG Interpretation  Date/Time:  Saturday November 15 2017 12:53:37  EST Ventricular Rate:  82 PR Interval:    QRS Duration: 91 QT Interval:  388 QTC Calculation: 373 R Axis:   3 Text Interpretation:  Sinus rhythm Abnormal R-wave progression, early transition Borderline T wave abnormalities No significant change since last tracing Confirmed by Orlie Dakin [id number]) on 11/15/2017 2:22:17 PM   Radiology No results found.  Procedures Procedures (including critical care time)  Medications Ordered in ED Medications  sodium chloride 0.9 % bolus 500 mL (500 mLs Intravenous New Bag/Given 11/15/17 1314)     Initial Impression / Assessment and Plan / ED Course  I have reviewed the triage vital signs and the nursing notes.  Pertinent labs & imaging results that were available during my care of the patient were reviewed by me and considered in my medical decision making (see chart for details).    Edward English is a 84 y.o. male who presents to ED for evaluation following syncopal episode just prior to arrival.  EKG with no acute change from previous.  Afebrile, hemodynamically stable with no focal neuro deficits on exam.  Labs reviewed and reassuring.  History of hyperlipidemia and peripheral vascular disease.  Given age and risk factors, recommended admission for syncope work-up for possible cardiogenic etiology.  Medicine to admit.  Patient seen by and discussed with Dr. Winfred Leeds who agrees with treatment  plan.     Final Clinical Impressions(s) / ED Diagnoses   Final diagnoses:  Syncope and collapse    ED Discharge Orders    None       Ward, Ozella Almond, PA-C 11/15/17 1454    Orlie Dakin, MD 11/15/17 1656

## 2017-11-15 NOTE — ED Provider Notes (Signed)
Patient had syncopal event while cooking this afternoon.  He had no forewarning except for felt weak a few seconds before syncopal event.  He denies pain anywhere.  He is presently asymptomatic.  Exam no distress alert Glasgow Coma Score 15 HEENT exam no facial asymmetry neck supple achy midline no bruit lungs clear to auscultation heart regular rate and rhythm no murmurs abdomen soft nontender extremities without edema   Doug Sou, MD 11/15/17 1517

## 2017-11-15 NOTE — Discharge Summary (Addendum)
[facility] Discharge Summary  Patient name: Edward English Medical record number: [id number] Date of birth: 07/28/1933 Age: 84 y.o. Gender: male Date of Admission: 11/15/2017  Date of Discharge: 11/16/2017 Admitting Physician: Blane Ohara Maryjo Ragon, MD  Primary Care Provider: Patient, No Pcp Per Consultants: None  Indication for Hospitalization: Syncopal Episode  Discharge Diagnoses/Problem List:  Principle Diagnosis: Orthostatic hypotension related syncope Other Diagnoses: History of syncopal episodes Hyperlipidemia Prediabetes Overactive bladder BPH Lumbar spinal stenosis Peripheral neuropathy  Former smoker History of UTI  Disposition: Discharge home  Discharge Condition: Stable  Discharge Exam: from progress note General: laying comfortably in bed, in NAD Cardiovascular: RRR, no murmurs Respiratory: CTAB, NWOB Abdomen: soft, nontender, nondistended, + bowel sounds Extremities:  Warm and well perfused, no edema   Brief Hospital Course:  Edward English is an 84 year old male admitted for observation after a syncopal episode.  BMP in the emergency department showed slightly elevated creatinine at 1.25 and he was bolused 500 cc normal saline in the ED. On recheck the Creatinine had decreased to 1.02. EKG showed abnormal R wave progression and T wave abnormalities, but was otherwise in normal sinus rhythm. A lipid panel showed HDL improved but slightly decreased to 33 and LDL improved but high at 116. Chest X-ray showed aortic atherosclerosis, but was without edema, consolidation or cardiomegaly. An echo performed showed EF 55% with mild LVH and G1DD. It appears orthostatic vitals were not recorded.   The patient remained stable and imaging and tests were unremarkable for a cause of the syncope. The patient was medically cleared for discharge home on 11/15/2017.  Issues for Follow Up:  1. Continue to encourage healthy lifestyle modifications to  prevent diabetes from developing, as last A1c 6.1%. 2. Perform and evaluate orthostatic vitals outpatient.  Significant Procedures: none  Significant Labs and Imaging:  Recent Labs  Lab 11/15/17 1253 11/16/17 0439  WBC 9.1 7.8  HGB 13.6 12.3*  HCT 41.7 37.2*  PLT 263 213   Recent Labs  Lab 11/15/17 1253 11/16/17 0439  NA 137 139  K 4.4 3.9  CL 103 109  CO2 25 24  GLUCOSE 131* 130*  BUN 17 15  CREATININE 1.25* 1.02  CALCIUM 9.2 8.8*  ALKPHOS 46  --   AST 20  --   ALT 20  --   ALBUMIN 3.9  --    Lipid Panel     Component Value Date/Time   CHOL 177 11/15/2017 1901   TRIG 141 11/15/2017 1901   HDL 33 (L) 11/15/2017 1901   CHOLHDL 5.4 11/15/2017 1901   VLDL 28 11/15/2017 1901   LDLCALC 116 (H) 11/15/2017 1901   LDLDIRECT 97.2 11/30/2013 1336   HbA1c: 6.1 TSH: 3.193  Results/Tests Pending at Time of Discharge:   Discharge Medications:  Allergies as of 11/16/2017      Reactions   Valdecoxib Rash      Medication List    STOP taking these medications   DULoxetine 60 MG capsule Commonly known as:  CYMBALTA     TAKE these medications   finasteride 5 MG tablet Commonly known as:  PROSCAR Take 5 mg by mouth daily.   lovastatin 40 MG tablet Commonly known as:  MEVACOR Take 40 mg by mouth daily.   OXcarbazepine 150 MG tablet Commonly known as:  TRILEPTAL Take 300 mg by mouth 3 (three) times daily.   tolterodine 2 MG tablet Commonly known as:  DETROL Take 2 mg by mouth 2 (two) times daily.  TYLENOL 500 MG tablet Generic drug:  acetaminophen Take 500 mg by mouth every 6 (six) hours as needed.       Discharge Instructions: Please refer to Patient Instructions section of EMR for full details.  Patient was counseled important signs and symptoms that should prompt return to medical care, changes in medications, dietary instructions, activity restrictions, and follow up appointments.   Follow-Up Appointments: Patient instructed to schedule hospital  follow up appointment with his primary care physician.  Daisy Floro, DO 11/18/2017, 11:32 PM PGY-1, [place]

## 2017-11-15 NOTE — Progress Notes (Signed)
Patient refused lovenox shot. RN educated patient on medication. Patient verbalizes understanding and wishes to refuse. No other complaints at this time. Will continue to monitor patient.

## 2017-11-15 NOTE — Progress Notes (Signed)
  Echocardiogram 2D Echocardiogram has been performed.  Edward English 11/15/2017, 5:13 PM

## 2017-11-15 NOTE — Progress Notes (Signed)
New Admission Note:   Arrival Method: From ED Mental Orientation: Alert and oriented x 4 Telemetry:BOX13 Assessment: Completed Skin: Intact Pain:0/10 Tubes: None Safety Measures: Safety Fall Prevention Plan has been discussed  Admission:  [facility] [place] Orientation: Patient has been orientated to the room, unit and staff.  Family: None at bedside Decided to keep all personal belonging at bedside. Advised about policy.  Orders to be reviewed and implemented. Will continue to monitor the patient. Call light has been placed within reach and bed alarm has been de-activated per patient request. Patein educated prior .   Bettey Mare, RN Phone: [phone]

## 2017-11-16 ENCOUNTER — Encounter (HOSPITAL_COMMUNITY): Payer: Self-pay | Admitting: Cardiology

## 2017-11-16 DIAGNOSIS — R55 Syncope and collapse: Secondary | ICD-10-CM | POA: Diagnosis not present

## 2017-11-16 DIAGNOSIS — I951 Orthostatic hypotension: Secondary | ICD-10-CM | POA: Diagnosis not present

## 2017-11-16 LAB — CBC
HCT: 37.2 % — ABNORMAL LOW (ref 39.0–52.0)
Hemoglobin: 12.3 g/dL — ABNORMAL LOW (ref 13.0–17.0)
MCH: 32.1 pg (ref 26.0–34.0)
MCHC: 33.1 g/dL (ref 30.0–36.0)
MCV: 97.1 fL (ref 80.0–100.0)
NRBC: 0 % (ref 0.0–0.2)
PLATELETS: 213 10*3/uL (ref 150–400)
RBC: 3.83 MIL/uL — ABNORMAL LOW (ref 4.22–5.81)
RDW: 13.3 % (ref 11.5–15.5)
WBC: 7.8 10*3/uL (ref 4.0–10.5)

## 2017-11-16 LAB — BASIC METABOLIC PANEL
Anion gap: 6 (ref 5–15)
BUN: 15 mg/dL (ref 8–23)
CALCIUM: 8.8 mg/dL — AB (ref 8.9–10.3)
CO2: 24 mmol/L (ref 22–32)
Chloride: 109 mmol/L (ref 98–111)
Creatinine, Ser: 1.02 mg/dL (ref 0.61–1.24)
GFR calc non Af Amer: >60 mL/min (ref >60)
GLUCOSE: 130 mg/dL — AB (ref 70–99)
Potassium: 3.9 mmol/L (ref 3.5–5.1)
Sodium: 139 mmol/L (ref 135–145)

## 2017-11-16 LAB — ECHOCARDIOGRAM COMPLETE
Height: 69 in
WEIGHTICAEL: 3075.2 [oz_av]

## 2017-11-16 NOTE — Discharge Instructions (Signed)
It has been a pleasure taking care of you! You were admitted due to syncope and were observed in the hospital overnight. With that your symptoms resolved and we think it is safe to let you go home and follow up with your primary care doctor.   The echocardiogram of your heart appeared normal, so we do not think that you have a heart valve issue leading to your loss of consciousness.  Please work on staying hydrated and make sure to discuss this with your doctor when you see him or her.  Please follow up at your primary care doctor's office. Please call and make a hospital follow up appointment to be seen within the next 1 week.

## 2017-11-16 NOTE — Progress Notes (Signed)
Pt has orders to be discharged. Discharge instructions given and pt has no additional questions at this time. Medication regimen reviewed and pt educated. Pt verbalized understanding and has no additional questions. Telemetry box removed. IV removed and site in good condition. Pt stable and waiting for transportation. 

## 2017-12-08 ENCOUNTER — Encounter: Payer: Self-pay | Admitting: Cardiovascular Disease

## 2017-12-08 NOTE — Progress Notes (Signed)
Cardiology Office Note   Date:  12/09/2017   ID:  Edward English, DOB June 17, 1933, MRN [id number]  PCP:  [facility]  Cardiologist:   Jenkins Rouge, MD   No chief complaint on file.     History of Present Illness: Edward English is a 84 y.o. male who presents for consultation regarding syncope Referred by [facility] Dr Shan Levans.  History of HLD, DM, BPH and lumbar stenosis with neuropathy. 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 No trauma. Cr up a bit Indicating some azotemia. Given saline bolus in ER Admitted BP 114/72 He also lost bladder control Has had similar episodes In past with negative cardiac w/u No history of arrhythmia or structural heart problem. Told to stop Cymbalta   TTE done post d/c 11/15/17 EF 55% no significant valve disease and normal estimated PA pressure  Office note 12/03/17 noted SBP dropped from 152 to 124 sitting to standing Thinks Trileptal making symptoms worse Also on cymbalta and flomax that will make orthostasis worse   Labs 12/03/17 Hct 40 BUN 13 Cr 1 A1c 6.3    He dates all his issues back to multiple UTI"s in June. Was shaky and off balance since then. No chest Pain dyspnea or palpitations He was not postural in our office today  Neuro had given him carbamazepine For neuropathy but he stopped it made dizziness worse Also now off Cymbalta and Flomax which may  Have helped with postural changes.   125/85 sitting 130/85 standing   Past Medical History:  Diagnosis Date  . BENIGN PROSTATIC HYPERTROPHY, HX OF 02/17/2007   Qualifier: Diagnosis of  By: [place], [place]    . DIVERTICULITIS-COLON 02/04/2008   Qualifier: Diagnosis of  By: Sharlett Iles MD Byrd Hesselbach ERECTILE DYSFUNCTION, MILD 02/17/2007   Qualifier: Diagnosis of  By: [place], [place]    . HYPERGLYCEMIA 02/17/2007   Qualifier: Diagnosis  of  By: Danny Lawless CMA, [place]    . HYPERLIPIDEMIA 02/17/2007   Qualifier: Diagnosis of  By: Danny Lawless CMA, [place]    . HYPOGONADISM 02/17/2007   Qualifier: Diagnosis of  By: Danny Lawless CMA, [place]    . OVERACTIVE BLADDER 02/17/2007   Qualifier: Diagnosis of  By: Danny Lawless CMA, [place]    . PERIPHERAL NEUROPATHY 02/17/2007   Qualifier: Diagnosis of  By: [place], [place]    . PERIPHERAL VASCULAR DISEASE 02/17/2007   Qualifier: Diagnosis of  By: [place], [place]    . Regional enteritis of large intestine ([place]) 06/23/2008   Qualifier: Diagnosis of  By: Sharlett Iles MD FACG, [place], HX OF 12/26/2009   Qualifier: Diagnosis of  By: Bullins CMA, [place]    . TONSILLECTOMY, HX OF 02/17/2007   Qualifier: Diagnosis of  By: [place], [place]      Past Surgical History:  Procedure Laterality Date  . cervical hemilaminectomy     C5-6, C6-7  . dupuytrens contracture relief    . Ectropion correction  2010   Left lower eye lid  . SEPTOPLASTY    . SHOULDER ARTHROSCOPY    . TONSILLECTOMY       Current Outpatient Medications  Medication Sig Dispense Refill  . acetaminophen (TYLENOL) 500 MG tablet Take 500 mg by mouth every 6 (six) hours as needed.    . finasteride (PROSCAR) 5 MG tablet Take 5 mg  by mouth daily.    Marland Kitchen lovastatin (MEVACOR) 40 MG tablet Take 40 mg by mouth daily.    . OXcarbazepine (TRILEPTAL) 150 MG tablet Take 300 mg by mouth 3 (three) times daily.    Marland Kitchen tolterodine (DETROL) 2 MG tablet Take 2 mg by mouth 2 (two) times daily.     No current facility-administered medications for this visit.     Allergies:   Valdecoxib    Social History:  The patient  reports that he quit smoking about 29 years ago. He has never used smokeless tobacco. He reports that he drinks alcohol. He reports that he does not use drugs.   Family History:  The patient's family history includes Alzheimer's disease in his father; Diabetes in his mother.    ROS:  Please see the history of present  illness.   Otherwise, review of systems are positive for none.   All other systems are reviewed and negative.    PHYSICAL EXAM: VS:  BP 132/74   Pulse (!) 103   Ht 5' 9"  (1.753 m)   Wt 198 lb (89.8 kg)   SpO2 93%   BMI 29.24 kg/m  , BMI Body mass index is 29.24 kg/m. Affect appropriate Healthy:  appears stated age 47: normal Neck supple with no adenopathy JVP normal no bruits no thyromegaly Lungs clear with no wheezing and good diaphragmatic motion Heart:  S1/S2 no murmur, no rub, gallop or click PMI normal Abdomen: benighn, BS positve, no tenderness, no AAA no bruit.  No HSM or HJR Distal pulses intact with no bruits No edema Neuro non-focal Skin warm and dry No muscular weakness    EKG:  SR rate 82 normal 11/15/17    Recent Labs: 11/15/2017: ALT 20; TSH 3.193 11/16/2017: BUN 15; Creatinine, Ser 1.02; Hemoglobin 12.3; Platelets 213; Potassium 3.9; Sodium 139    Lipid Panel    Component Value Date/Time   CHOL 177 11/15/2017 1901   TRIG 141 11/15/2017 1901   HDL 33 (L) 11/15/2017 1901   CHOLHDL 5.4 11/15/2017 1901   VLDL 28 11/15/2017 1901   LDLCALC 116 (H) 11/15/2017 1901   LDLDIRECT 97.2 11/30/2013 1336      Wt Readings from Last 3 Encounters:  12/09/17 198 lb (89.8 kg)  11/16/17 190 lb 9.6 oz (86.5 kg)  08/30/15 192 lb (87.1 kg)      Other studies Reviewed: Additional studies/ records that were reviewed today include: Notes from hospital and [facility]  Labs, CXR, TTE and ECGls .    ASSESSMENT AND PLAN:  1.  Syncope: unknown etiology ECG ok no structural heart issues on TTE. No arrhythmia in hospital. Had  Another episode last Saturday after d/c Prolonged nature of recovery and prodrome both suggest neurologic Etiology. Favor referral to EP to get ILR to r/o arrhythmia. He has f/u appointment with neurology at [facility] this Friday Suggested that he get EEG and MRI of head  2. HLD: continue statin labs with primary  3. Prostate:  Avoid  flomax or vasodilators continue Proscar F/U PSA with primary    Current medicines are reviewed at length with the patient today.  The patient does not have concerns regarding medicines.  The following changes have been made:  no change  Labs/ tests ordered today include: EP referral   Orders Placed This Encounter  Procedures  . Ambulatory referral to Cardiac Electrophysiology     Disposition:   FU with EP for ILR     Signed, Jenkins Rouge, MD  12/09/2017 3:43 PM    [facility] [place], [place], [place]  [address] Phone: [phone]; Fax: [phone]

## 2017-12-09 ENCOUNTER — Encounter: Payer: Self-pay | Admitting: Cardiovascular Disease

## 2017-12-09 ENCOUNTER — Ambulatory Visit: Payer: Medicare HMO | Admitting: Cardiovascular Disease

## 2017-12-09 VITALS — BP 132/74 | HR 103 | Ht 69.0 in | Wt 198.0 lb

## 2017-12-09 DIAGNOSIS — E785 Hyperlipidemia, unspecified: Secondary | ICD-10-CM

## 2017-12-09 DIAGNOSIS — R55 Syncope and collapse: Secondary | ICD-10-CM | POA: Diagnosis not present

## 2017-12-09 NOTE — Patient Instructions (Addendum)
Medication Instructions:   If you need a refill on your cardiac medications before your next appointment, please call your pharmacy.   Lab work:  If you have labs (blood work) drawn today and your tests are completely normal, you will receive your results only by: Marland Kitchen. MyChart Message (if you have MyChart) OR . A paper copy in the mail If you have any lab test that is abnormal or we need to change your treatment, we will call you to review the results.  Testing/Procedures: NONE ordered at this time  Follow-Up: At [facility], you and your health needs are our priority.  As part of our continuing mission to provide you with exceptional heart care, we have created designated Provider Care Teams.  These Care Teams include your primary Cardiologist (physician) and Advanced Practice Providers (APPs -  Physician Assistants and Nurse Practitioners) who all work together to provide you with the care you need, when you need it. Your physician recommends that you schedule a follow-up appointment as needed with Dr. Eden EmmsNishan.   Your physician recommends that you schedule a follow-up appointment with an EP doctor as soon as possible for syncope.

## 2017-12-23 ENCOUNTER — Institutional Professional Consult (permissible substitution): Payer: Medicare HMO | Admitting: Cardiology

## 2017-12-23 NOTE — Progress Notes (Deleted)
Electrophysiology Office Note   Date:  12/23/2017   ID:  Edward English, DOB [birth year], MRN [id number]  PCP:  [facility], [facility]  Cardiologist:  Johnsie Cancel Primary Electrophysiologist:  Sirus Labrie Meredith Leeds, MD    No chief complaint on file.    History of Present Illness: Edward English is a 84 y.o. male who is being seen today for the evaluation of syncope at the request of Jenkins Rouge. Presenting today for electrophysiology evaluation.  He has a history of hyperlipidemia, diabetes, BPH, and lumbar stenosis with neuropathy.  He is admitted to the hospital 11/15/2017 with orthostatic symptoms and syncope.  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  He did not have any trauma.  He lost bladder control at the time.  He has had similar episodes.  Cardiac work-up in the past has been negative.  He has no history of arrhythmia or structural heart problems.  His Cymbalta was stopped.    Today, he denies*** symptoms of palpitations, chest pain, shortness of breath, orthopnea, PND, lower extremity edema, claudication, dizziness, presyncope, syncope, bleeding, or neurologic sequela. The patient is tolerating medications without difficulties.    Past Medical History:  Diagnosis Date  . BENIGN PROSTATIC HYPERTROPHY, HX OF 02/17/2007   Qualifier: Diagnosis of  By: [place], [place]    . DIVERTICULITIS-COLON 02/04/2008   Qualifier: Diagnosis of  By: Sharlett Iles MD Byrd Hesselbach ERECTILE DYSFUNCTION, MILD 02/17/2007   Qualifier: Diagnosis of  By: [place], [place]    . HYPERGLYCEMIA 02/17/2007   Qualifier: Diagnosis of  By: Danny Lawless CMA, [place]    . HYPERLIPIDEMIA 02/17/2007   Qualifier: Diagnosis of  By: Danny Lawless CMA, [place]    . HYPOGONADISM 02/17/2007   Qualifier: Diagnosis of  By: Danny Lawless CMA, [place]    . OVERACTIVE BLADDER 02/17/2007   Qualifier: Diagnosis of  By: Danny Lawless CMA, [place]    . PERIPHERAL NEUROPATHY 02/17/2007   Qualifier:  Diagnosis of  By: [place], [place]    . PERIPHERAL VASCULAR DISEASE 02/17/2007   Qualifier: Diagnosis of  By: [place], [place]    . Regional enteritis of large intestine ([place]) 06/23/2008   Qualifier: Diagnosis of  By: Sharlett Iles MD FACG, [place], HX OF 12/26/2009   Qualifier: Diagnosis of  By: Bullins CMA, [place]    . TONSILLECTOMY, HX OF 02/17/2007   Qualifier: Diagnosis of  By: [place], [place]     Past Surgical History:  Procedure Laterality Date  . cervical hemilaminectomy     C5-6, C6-7  . dupuytrens contracture relief    . Ectropion correction  2010   Left lower eye lid  . SEPTOPLASTY    . SHOULDER ARTHROSCOPY    . TONSILLECTOMY       Current Outpatient Medications  Medication Sig Dispense Refill  . acetaminophen (TYLENOL) 500 MG tablet Take 500 mg by mouth every 6 (six) hours as needed.    . finasteride (PROSCAR) 5 MG tablet Take 5 mg by mouth daily.    Marland Kitchen lovastatin (MEVACOR) 40 MG tablet Take 40 mg by mouth daily.    . OXcarbazepine (TRILEPTAL) 150 MG tablet Take 300 mg by mouth 3 (three) times daily.    Marland Kitchen tolterodine (DETROL) 2 MG tablet Take 2 mg by mouth 2 (two) times daily.     No current facility-administered medications for this visit.     Allergies:   Valdecoxib  Social History:  The patient  reports that he quit smoking about 29 years ago. He has never used smokeless tobacco. He reports current alcohol use. He reports that he does not use drugs.   Family History:  The patient's ***family history includes Alzheimer's disease in his father; Diabetes in his mother.    ROS:  Please see the history of present illness.   Otherwise, review of systems is positive for ***.   All other systems are reviewed and negative.    PHYSICAL EXAM: VS:  There were no vitals taken for this visit. , BMI There is no height or weight on file to calculate BMI. GEN: Well nourished, well developed, in no acute distress  HEENT: normal  Neck: no JVD, carotid  bruits, or masses Cardiac: ***RRR; no murmurs, rubs, or gallops,no edema  Respiratory:  clear to auscultation bilaterally, normal work of breathing GI: soft, nontender, nondistended, + BS MS: no deformity or atrophy  Skin: warm and dry Neuro:  Strength and sensation are intact Psych: euthymic mood, full affect  EKG:  EKG {ACTION; IS/IS [id number]} ordered today. Personal review of the ekg ordered shows ***  Recent Labs: 11/15/2017: ALT 20; TSH 3.193 11/16/2017: BUN 15; Creatinine, Ser 1.02; Hemoglobin 12.3; Platelets 213; Potassium 3.9; Sodium 139    Lipid Panel     Component Value Date/Time   CHOL 177 11/15/2017 1901   TRIG 141 11/15/2017 1901   HDL 33 (L) 11/15/2017 1901   CHOLHDL 5.4 11/15/2017 1901   VLDL 28 11/15/2017 1901   LDLCALC 116 (H) 11/15/2017 1901   LDLDIRECT 97.2 11/30/2013 1336     Wt Readings from Last 3 Encounters:  12/09/17 198 lb (89.8 kg)  11/16/17 190 lb 9.6 oz (86.5 kg)  08/30/15 192 lb (87.1 kg)      Other studies Reviewed: Additional studies/ records that were reviewed today include: TTE 11/15/17  Review of the above records today demonstrates:  - Left ventricle: The cavity size was normal. Wall thickness was   increased in a pattern of mild LVH. Systolic function was normal.   The estimated ejection fraction was 55%. Although no diagnostic   regional wall motion abnormality was identified, this possibility   cannot be completely excluded on the basis of this study. Doppler   parameters are consistent with abnormal left ventricular   relaxation (grade 1 diastolic dysfunction). - Aortic valve: Mildly calcified annulus. Trileaflet; mildly   calcified leaflets. There was trivial regurgitation. - Mitral valve: Mildly calcified annulus. There was trivial   regurgitation. - Right atrium: Central venous pressure (est): 3 mm Hg. - Atrial septum: No defect or patent foramen ovale was identified. - Tricuspid valve: There was mild regurgitation. -  Pulmonary arteries: PA peak pressure: 23 mm Hg (S). - Pericardium, extracardiac: There was no pericardial effusion.   ASSESSMENT AND PLAN:  1.  Syncope: No obvious EKG etiology no structural heart issues on echo.  He had no arrhythmias while he was in the hospital.  ***  2.  Hyperlipidemia: Continue statin per primary physician.   Current medicines are reviewed at length with the patient today.   The patient does not have concerns regarding his medicines.  The following changes were made today:  {NONE DEFAULTED:18576::"none"}  Labs/ tests ordered today include: *** No orders of the defined types were placed in this encounter.    Disposition:   FU with Torben Soloway {gen number 1-51:761607} {Days to years:10300}  Signed, Ellarose Brandi Meredith Leeds, MD  12/23/2017 8:00 AM  [place] [address] [phone] (office) [phone] (fax)

## 2018-01-09 ENCOUNTER — Encounter: Payer: Self-pay | Admitting: *Deleted

## 2018-01-15 ENCOUNTER — Encounter: Payer: Self-pay | Admitting: Cardiology

## 2018-01-15 ENCOUNTER — Ambulatory Visit: Payer: Medicare HMO | Admitting: Cardiology

## 2018-01-15 VITALS — BP 126/70 | HR 98 | Ht 69.0 in | Wt 198.0 lb

## 2018-01-15 DIAGNOSIS — E785 Hyperlipidemia, unspecified: Secondary | ICD-10-CM

## 2018-01-15 NOTE — Patient Instructions (Signed)
Medication Instructions:  Your physician recommends that you continue on your current medications as directed. Please refer to the Current Medication list given to you today.  * If you need a refill on your cardiac medications before your next appointment, please call your pharmacy.   Labwork: None ordered  Testing/Procedures: Your physician has recommended that you have a loop recorder implanted.  See instructions below under sperical instructions area.    Follow-Up: Your physician recommends that you schedule a follow-up appointment in: 7-10 days, after your procedure on 01/23/18, with device clinic for a wound check.  Your physician recommends that you schedule a follow-up appointment in: 91 days, after your procedure on 01/23/18, with Dr. Elberta Fortis.   Thank you for choosing [facility]!!   Dory Horn, RN [phone]  Any Other Special Instructions Will Be Listed Below (If Applicable).  LINQ INSTRUCTIONS  1. Please arrive at the [facility], Entrance "A"  at [facility] at  12:30 pm   on  the day of your procedure. (The address is [address]  2. Wash your chest and neck with surgical scrub the evening before and the morning of your procedure.  Rinse well. Please review the surgical scrub instruction sheet given to you.  * Every attempt is made to prevent procedures from being rescheduled.  Due to the nature of  Electrophysiology, rescheduling can happen.  The physician is always aware and directs the staff when this occurs.     Implantable Loop Recorder Placement An implantable loop recorder is a small electronic device that is placed under the skin of your chest. It is about the size of an AA ("double A") battery. The device records the electrical activity of your heart over a long period of time. Your health care provider can download these recordings to monitor your heart. You may need an implantable loop recorder if you have periods of abnormal  heart activity (arrhythmias) or unexplained fainting (syncope) caused by a heart problem. Tell a health care provider about:  Any allergies you have.  All medicines you are taking, including vitamins, herbs, eye drops, creams, and over-the-counter medicines.  Any problems you or family members have had with anesthetic medicines.  Any blood disorders you have.  Any surgeries you have had.  Any medical conditions you have.  Whether you are pregnant or may be pregnant. What are the risks? Generally, this is a safe procedure. However, as with any procedure, problems may occur, including:  Infection.  Bleeding.  Allergic reactions to anesthetic medicines.  Damage to nerves or blood vessels.  Failure of the device to work. This could require another surgery to replace it.  What happens before the procedure?   You may have a physical exam, blood tests, and imaging tests of your heart, such as a chest X-ray.  Follow instructions from your health care provider about eating or drinking restrictions.  Ask your health care provider about: ? Changing or stopping your regular medicines. This is especially important if you are taking diabetes medicines or blood thinners. ? Taking medicines such as aspirin and ibuprofen. These medicines can thin your blood. Do not take these medicines before your procedure if your surgeon instructs you not to.  Ask your health care provider how your surgical site will be marked or identified.  You may be given antibiotic medicine to help prevent infection.  Plan to have someone take you home after the procedure.  If you will be going home right after  the procedure, plan to have someone with you for 24 hours.  Do not use any tobacco products, such as cigarettes, chewing tobacco, and e-cigarettes as told by your surgeon. If you need help quitting, ask your health care provider. What happens during the procedure?  To reduce your risk of  infection: ? Your health care team will wash or sanitize their hands. ? Your skin will be washed with soap.  An IV tube will be inserted into one of your veins.  You may be given an antibiotic medicine through the IV tube.  You may be given one or more of the following: ? A medicine to help you relax (sedative). ? A medicine to numb the area (local anesthetic).  A small cut (incision) will be made on the left side of your upper chest.  A pocket will be created under your skin.  The device will be placed in the pocket.  The incision will be closed with stitches (sutures) or adhesive strips.  A bandage (dressing) will be placed over the incision. The procedure may vary among health care providers and hospitals. What happens after the procedure?  Your blood pressure, heart rate, breathing rate, and blood oxygen level will be monitored often until the medicines you were given have worn off.  You may be able to go home on the day of your surgery. Before going home: ? Your health care provider will program your recorder. ? You will learn how to trigger your device with a handheld activator. ? You will learn how to send recordings to your health care provider. ? You will get an ID card for your device, and you will be told when to use it.  Do not drive for 24 hours if you received a sedative. This information is not intended to replace advice given to you by your health care provider. Make sure you discuss any questions you have with your health care provider. Document Released: 12/05/2014 Document Revised: 06/01/2015 Document Reviewed: 09/28/2014 Elsevier Interactive Patient Education  2018 [employer].    Implantable Loop Recorder Placement, Care After Refer to this sheet in the next few weeks. These instructions provide you with information about caring for yourself after your procedure. Your health care provider may also give you more specific instructions. Your treatment has  been planned according to current medical practices, but problems sometimes occur. Call your health care provider if you have any problems or questions after your procedure. What can I expect after the procedure? After the procedure, it is common to have:  Soreness or pain near the cut from surgery (incision).  Some swelling or bruising near the incision.  Follow these instructions at home: Medicines  Take over-the-counter and prescription medicines only as told by your health care provider.  If you were prescribed an antibiotic medicine, take it as told by your health care provider. Do not stop taking the antibiotic even if you start to feel better. Bathing  Do not take baths, swim, or use a hot tub until your health care provider approves. Ask your health care provider if you can take showers. You may only be allowed to take sponge baths for bathing. Incision care  Follow instructions from your health care provider about how to take care of your incision. Make sure you: ? Wash your hands with soap and water before you change your bandage (dressing). If soap and water are not available, use hand sanitizer. ? Change your dressing as told by your health care provider. ?  Keep your dressing dry. ? Leave stitches (sutures), skin glue, or adhesive strips in place. These skin closures may need to stay in place for 2 weeks or longer. If adhesive strip edges start to loosen and curl up, you may trim the loose edges. Do not remove adhesive strips completely unless your health care provider tells you to do that.  Check your incision area every day for signs of infection. Check for: ? More redness, swelling, or pain. ? Fluid or blood. ? Warmth. ? Pus or a bad smell. Driving  If you received a sedative, do not drive for 24 hours after the procedure.  If you did not receive a sedative, ask your health care provider when it is safe to drive. Activity  Return to your normal activities as told  by your health care provider. Ask your health care provider what activities are safe for you.  Until your health care provider says it is safe: ? Do not lift anything that is heavier than 10 lb (4.5 kg). ? Do not do activities that involve lifting your arms over your head. General instructions   Follow instructions from your health care provider about how and when to use your implantable loop recorder.  Do not go through a metal detection gate, and do not let someone hold a metal detector over your chest. Show your ID card.  Do not have an MRI unless you check with your health care provider first.  Do not use any tobacco products, such as cigarettes, chewing tobacco, and e-cigarettes. Tobacco can delay healing. If you need help quitting, ask your health care provider.  Keep all follow-up visits as told by your health care provider. This is important. Contact a health care provider if:  You have more redness, swelling, or pain around your incision.  You have more fluid or blood coming from your incision.  Your incision feels warm to the touch.  You have pus or a bad smell coming from your incision.  You have a fever.  You have pain that is not relieved by your pain medicine.  You have triggered your device because of fainting (syncope) or because of a heartbeat that feels like it is racing, slow, fluttering, or skipping (palpitations). Get help right away if:  You have chest pain.  You have difficulty breathing. This information is not intended to replace advice given to you by your health care provider. Make sure you discuss any questions you have with your health care provider. Document Released: 12/05/2014 Document Revised: 06/01/2015 Document Reviewed: 09/28/2014 Elsevier Interactive Patient Education  [employer]2018 Elsevier Inc.

## 2018-01-15 NOTE — Progress Notes (Signed)
Electrophysiology Office Note   Date:  01/15/2018   ID:  Edward English, DOB 02-13-1933, MRN [id number]  PCP:  Patient, No Pcp Per  Cardiologist:  Johnsie Cancel Primary Electrophysiologist:   Meredith Leeds, MD    No chief complaint on file.    History of Present Illness: Edward English is a 85 y.o. male who is being seen today for the evaluation of syncope at the request of Jenkins Rouge. Presenting today for electrophysiology evaluation.  He was initially admitted to the hospital on 11/15/2017 with orthostatic symptoms and possible syncope.  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  He had no trauma.  He was given a saline bolus in the emergency room which got his blood pressure to 114/72.  He did lose bladder control.  He has had similar symptoms in the past.  Cardiac work-up has been negative in the past with no arrhythmia or structural heart problems.  He is off his Cymbalta and Flomax which is helped with his dizziness.  Prior to his episodes of syncope, he feels weak, fatigued.  He has diaphoresis.  This is not associated with changing positions.  Today, he denies symptoms of palpitations, chest pain, shortness of breath, orthopnea, PND, lower extremity edema, claudication, dizziness, presyncope, syncope, bleeding, or neurologic sequela. The patient is tolerating medications without difficulties.    Past Medical History:  Diagnosis Date  . BENIGN PROSTATIC HYPERTROPHY, HX OF 02/17/2007   Qualifier: Diagnosis of  By: [place], [place]    . DIVERTICULITIS-COLON 02/04/2008   Qualifier: Diagnosis of  By: Sharlett Iles MD Byrd Hesselbach ERECTILE DYSFUNCTION, MILD 02/17/2007   Qualifier: Diagnosis of  By: [place], [place]    . HYPERGLYCEMIA 02/17/2007   Qualifier: Diagnosis of  By: Danny Lawless CMA, [place]    . HYPERLIPIDEMIA 02/17/2007   Qualifier: Diagnosis of  By: Danny Lawless CMA, [place]    . HYPOGONADISM 02/17/2007   Qualifier: Diagnosis of  By: Danny Lawless CMA, [place]    .  OVERACTIVE BLADDER 02/17/2007   Qualifier: Diagnosis of  By: Danny Lawless CMA, [place]    . PERIPHERAL NEUROPATHY 02/17/2007   Qualifier: Diagnosis of  By: [place], [place]    . PERIPHERAL VASCULAR DISEASE 02/17/2007   Qualifier: Diagnosis of  By: [place], [place]    . Regional enteritis of large intestine ([place]) 06/23/2008   Qualifier: Diagnosis of  By: Sharlett Iles MD FACG, [place], HX OF 12/26/2009   Qualifier: Diagnosis of  By: Bullins CMA, [place]    . TONSILLECTOMY, HX OF 02/17/2007   Qualifier: Diagnosis of  By: [place], [place]     Past Surgical History:  Procedure Laterality Date  . cervical hemilaminectomy     C5-6, C6-7  . dupuytrens contracture relief    . Ectropion correction  2010   Left lower eye lid  . SEPTOPLASTY    . SHOULDER ARTHROSCOPY    . TONSILLECTOMY       Current Outpatient Medications  Medication Sig Dispense Refill  . acetaminophen (TYLENOL) 500 MG tablet Take 500 mg by mouth every 6 (six) hours as needed.    . finasteride (PROSCAR) 5 MG tablet Take 5 mg by mouth daily.    Marland Kitchen lovastatin (MEVACOR) 40 MG tablet Take 40 mg by mouth daily.    . OXcarbazepine (TRILEPTAL) 150 MG tablet Take 300 mg by mouth 3 (three) times daily.    Marland Kitchen tolterodine (DETROL) 2 MG tablet Take 2  mg by mouth 2 (two) times daily.     No current facility-administered medications for this visit.     Allergies:   Valdecoxib   Social History:  The patient  reports that he quit smoking about 29 years ago. He has never used smokeless tobacco. He reports current alcohol use. He reports that he does not use drugs.   Family History:  The patient's family history includes Alzheimer's disease in his father; Cancer in his brother and father; Deep vein thrombosis in his brother; Diabetes in his mother; Heart attack in his mother; Heart disease in his mother.    ROS:  Please see the history of present illness.   Otherwise, review of systems is positive for cough, wheezing, back pain.    All other systems are reviewed and negative.    PHYSICAL EXAM: VS:  BP 126/70   Pulse 98   Ht 5' 9"  (1.753 m)   Wt 198 lb (89.8 kg)   BMI 29.24 kg/m  , BMI Body mass index is 29.24 kg/m. GEN: Well nourished, well developed, in no acute distress  HEENT: normal  Neck: no JVD, carotid bruits, or masses Cardiac: RRR; no murmurs, rubs, or gallops,no edema  Respiratory:  clear to auscultation bilaterally, normal work of breathing GI: soft, nontender, nondistended, + BS MS: no deformity or atrophy  Skin: warm and dry Neuro:  Strength and sensation are intact Psych: euthymic mood, full affect  EKG:  EKG is ordered today. Personal review of the ekg ordered shows sinus rhythm, rate 98  Recent Labs: 11/15/2017: ALT 20; TSH 3.193 11/16/2017: BUN 15; Creatinine, Ser 1.02; Hemoglobin 12.3; Platelets 213; Potassium 3.9; Sodium 139    Lipid Panel     Component Value Date/Time   CHOL 177 11/15/2017 1901   TRIG 141 11/15/2017 1901   HDL 33 (L) 11/15/2017 1901   CHOLHDL 5.4 11/15/2017 1901   VLDL 28 11/15/2017 1901   LDLCALC 116 (H) 11/15/2017 1901   LDLDIRECT 97.2 11/30/2013 1336     Wt Readings from Last 3 Encounters:  01/15/18 198 lb (89.8 kg)  12/09/17 198 lb (89.8 kg)  11/16/17 190 lb 9.6 oz (86.5 kg)      Other studies Reviewed: Additional studies/ records that were reviewed today include: TTE 11/15/17  Review of the above records today demonstrates:  - Left ventricle: The cavity size was normal. Wall thickness was   increased in a pattern of mild LVH. Systolic function was normal.   The estimated ejection fraction was 55%. Although no diagnostic   regional wall motion abnormality was identified, this possibility   cannot be completely excluded on the basis of this study. Doppler   parameters are consistent with abnormal left ventricular   relaxation (grade 1 diastolic dysfunction). - Aortic valve: Mildly calcified annulus. Trileaflet; mildly   calcified leaflets.  There was trivial regurgitation. - Mitral valve: Mildly calcified annulus. There was trivial   regurgitation. - Right atrium: Central venous pressure (est): 3 mm Hg. - Atrial septum: No defect or patent foramen ovale was identified. - Tricuspid valve: There was mild regurgitation. - Pulmonary arteries: PA peak pressure: 23 mm Hg (S). - Pericardium, extracardiac: There was no pericardial effusion.   ASSESSMENT AND PLAN:  1.  Syncope: Etiology at this point unclear.  No structural abnormalities seen on echo.  He has had multiple episodes of syncope and near syncope over the last few years.  It certainly sound like there could be a vagal component, but there could also  be a heart rhythm issue as well.  We  thus plan for a Linq monitor implantation.  Risks and benefits discussed.  Risks include bleeding and infection.  The patient understands risks and is agreed to the procedure.  2.  Hyperlipidemia: Per primary cardiology    Current medicines are reviewed at length with the patient today.   The patient does not have concerns regarding his medicines.  The following changes were made today:  none  Labs/ tests ordered today include:  Orders Placed This Encounter  Procedures  . EKG 12-Lead     Disposition:   FU with   pending link monitor results  Signed,  Meredith Leeds, MD  01/15/2018 9:12 AM     [facility] [address] [place] [address] [phone] (office) [phone] (fax)

## 2018-01-23 ENCOUNTER — Ambulatory Visit (HOSPITAL_COMMUNITY)
Admission: RE | Admit: 2018-01-23 | Discharge: 2018-01-23 | Disposition: A | Discharge status: Home / Self Care | Payer: Medicare HMO | Attending: Cardiology | Admitting: Cardiology

## 2018-01-23 ENCOUNTER — Encounter (HOSPITAL_COMMUNITY)
Admission: RE | Disposition: A | Discharge status: Home / Self Care | Payer: Self-pay | Source: Home / Self Care | Attending: Cardiology

## 2018-01-23 ENCOUNTER — Other Ambulatory Visit: Payer: Self-pay

## 2018-01-23 DIAGNOSIS — R55 Syncope and collapse: Secondary | ICD-10-CM | POA: Insufficient documentation

## 2018-01-23 DIAGNOSIS — Z888 Allergy status to other drugs, medicaments and biological substances status: Secondary | ICD-10-CM | POA: Insufficient documentation

## 2018-01-23 DIAGNOSIS — N401 Enlarged prostate with lower urinary tract symptoms: Secondary | ICD-10-CM | POA: Insufficient documentation

## 2018-01-23 DIAGNOSIS — Z87891 Personal history of nicotine dependence: Secondary | ICD-10-CM | POA: Diagnosis not present

## 2018-01-23 DIAGNOSIS — N3281 Overactive bladder: Secondary | ICD-10-CM | POA: Diagnosis not present

## 2018-01-23 DIAGNOSIS — G629 Polyneuropathy, unspecified: Secondary | ICD-10-CM | POA: Diagnosis not present

## 2018-01-23 DIAGNOSIS — Z79899 Other long term (current) drug therapy: Secondary | ICD-10-CM | POA: Insufficient documentation

## 2018-01-23 DIAGNOSIS — Z8249 Family history of ischemic heart disease and other diseases of the circulatory system: Secondary | ICD-10-CM | POA: Insufficient documentation

## 2018-01-23 DIAGNOSIS — E785 Hyperlipidemia, unspecified: Secondary | ICD-10-CM | POA: Diagnosis not present

## 2018-01-23 HISTORY — PX: LOOP RECORDER INSERTION: EP1214

## 2018-01-23 SURGERY — LOOP RECORDER INSERTION

## 2018-01-23 MED ORDER — LIDOCAINE-EPINEPHRINE 1 %-1:100000 IJ SOLN
INTRAMUSCULAR | Status: AC
  Filled 2018-01-23: qty 1

## 2018-01-23 MED ORDER — LIDOCAINE-EPINEPHRINE 1 %-1:100000 IJ SOLN
INTRAMUSCULAR | Status: DC | PRN
  Administered 2018-01-23: 20 mL

## 2018-01-23 SURGICAL SUPPLY — 2 items
LOOP REVEAL LINQSYS (Prosthesis & Implant Heart) ×1 IMPLANT
PACK LOOP INSERTION (CUSTOM PROCEDURE TRAY) ×2 IMPLANT

## 2018-01-23 NOTE — H&P (Signed)
Edward English has presented today for surgery, with the diagnosis of syncope.  The various methods of treatment have been discussed with the patient and family. After consideration of risks, benefits and other options for treatment, the patient has consented to  Procedure(s): LINQ implant as a surgical intervention .  Risks include but not limited to bleeding,  infection, among others. The patient's history has been reviewed, patient examined, no change in status, stable for surgery.  I have reviewed the patient's chart and labs.  Questions were answered to the patient's satisfaction.    Bailee Metter Curt Bears, MD 01/23/2018 2:56 PM

## 2018-01-23 NOTE — Discharge Instructions (Signed)
SEE ADDITIONAL IMPLANTABLE LOOP RECORDER DISCHARGE INSTRUCTIONS/ COPY GIVEN TO PATIENT

## 2018-01-26 ENCOUNTER — Encounter (HOSPITAL_COMMUNITY): Payer: Self-pay | Admitting: Cardiology

## 2018-02-04 ENCOUNTER — Ambulatory Visit (INDEPENDENT_AMBULATORY_CARE_PROVIDER_SITE_OTHER): Payer: Medicare HMO | Admitting: Nurse Practitioner

## 2018-02-04 DIAGNOSIS — R55 Syncope and collapse: Secondary | ICD-10-CM

## 2018-02-04 LAB — CUP PACEART INCLINIC DEVICE CHECK
MDC IDC PG IMPLANT DT: 20200117
MDC IDC SESS DTM: 20200129124037

## 2018-02-04 NOTE — Progress Notes (Signed)
ILR wound check. Wound well healed. No episodes. Pt education complete

## 2018-02-25 ENCOUNTER — Ambulatory Visit (INDEPENDENT_AMBULATORY_CARE_PROVIDER_SITE_OTHER): Payer: Medicare HMO

## 2018-02-25 DIAGNOSIS — R55 Syncope and collapse: Secondary | ICD-10-CM | POA: Diagnosis not present

## 2018-02-25 DIAGNOSIS — I639 Cerebral infarction, unspecified: Secondary | ICD-10-CM

## 2018-02-26 LAB — CUP PACEART REMOTE DEVICE CHECK
Date Time Interrogation Session: 20200219194028
MDC IDC PG IMPLANT DT: 20200117

## 2018-03-05 NOTE — Progress Notes (Signed)
Carelink Summary Report / Loop Recorder 

## 2018-03-30 ENCOUNTER — Other Ambulatory Visit: Payer: Self-pay

## 2018-03-30 ENCOUNTER — Ambulatory Visit (INDEPENDENT_AMBULATORY_CARE_PROVIDER_SITE_OTHER): Payer: Medicare HMO | Admitting: *Deleted

## 2018-03-30 DIAGNOSIS — I639 Cerebral infarction, unspecified: Secondary | ICD-10-CM

## 2018-03-30 DIAGNOSIS — R55 Syncope and collapse: Secondary | ICD-10-CM | POA: Diagnosis not present

## 2018-03-31 LAB — CUP PACEART REMOTE DEVICE CHECK
Implantable Pulse Generator Implant Date: 20200117
MDC IDC SESS DTM: 20200323214033

## 2018-04-08 NOTE — Progress Notes (Signed)
Carelink Summary Report / Loop Recorder 

## 2018-04-16 ENCOUNTER — Telehealth: Payer: Self-pay | Admitting: *Deleted

## 2018-04-16 NOTE — Telephone Encounter (Signed)
Called patient to let them know due to recent COVID19 CDC and Health Department Protocols, we are not seeing patients in the office. We are instead seeing if they would like to schedule this appointment as a Virtual Appointment VIA Smartphone or Laptop. Unable to reach patient. LVMTCB  

## 2018-04-17 ENCOUNTER — Telehealth: Payer: Self-pay | Admitting: Cardiology

## 2018-04-17 NOTE — Telephone Encounter (Signed)
Yes, OK to make that switch in timing. Thanks.

## 2018-04-17 NOTE — Telephone Encounter (Signed)
Informed pt of MD recommendations. He agreed to every 91 days starting after 05/02/2018

## 2018-04-17 NOTE — Telephone Encounter (Signed)
Patient called and wanted to know the importance of remote monitoring. Patient was implanted w/ ILR January 2020 for syncope. I explained to patient that he has his ILR implanted for the syncope and  the ILR will help determine if it was related to any abnormal rhythm. Patient is concerned with the amount that is being billed every month for his ILR. He says it has a bill for over $200 but couldn't tell me if that was before or after insurance. I informed pt that I would reach out to MD about trying to switch the summary reports from every month to maybe once every 3-6 months and this would help with the cost. Pt verbalized understanding and would like to try this.

## 2018-04-21 NOTE — Telephone Encounter (Signed)
LVMTCB

## 2018-04-24 NOTE — Telephone Encounter (Signed)
Virtual Visit Pre-Appointment Phone Call  Steps For Call:  1. Confirm consent - "In the setting of the current Covid19 crisis, you are scheduled for a (phone or video) visit with your provider on (date) at (time).  Just as we do with many in-office visits, in order for you to participate in this visit, we must obtain consent.  If you'd like, I can send this to your mychart (if signed up) or email for you to review.  Otherwise, I can obtain your verbal consent now.  All virtual visits are billed to your insurance company just like a normal visit would be.  By agreeing to a virtual visit, we'd like you to understand that the technology does not allow for your provider to perform an examination, and thus may limit your provider's ability to fully assess your condition. If your provider identifies any concerns that need to be evaluated in person, we will make arrangements to do so.  Finally, though the technology is pretty good, we cannot assure that it will always work on either your or our end, and in the setting of a video visit, we may have to convert it to a phone-only visit.  In either situation, we cannot ensure that we have a secure connection.  Are you willing to proceed?" STAFF: Did the patient verbally acknowledge consent to telehealth visit? Document YES/NO here: YES  2. Confirm the BEST phone number to call the day of the visit by including in appointment notes  3. Give patient instructions for WebEx/MyChart download to smartphone as below or Doximity/Doxy.me if video visit (depending on what platform provider is using)  4. Advise patient to be prepared with their blood pressure, heart rate, weight, any heart rhythm information, their current medicines, and a piece of paper and pen handy for any instructions they may receive the day of their visit  5. Inform patient they will receive a phone call 15 minutes prior to their appointment time (may be from unknown caller ID) so they should be  prepared to answer  6. Confirm that appointment type is correct in Epic appointment notes (VIDEO vs PHONE)     TELEPHONE CALL NOTE  Edward English has been deemed a candidate for a follow-up tele-health visit to limit community exposure during the Covid-19 pandemic. I spoke with the patient via phone to ensure availability of phone/video source, confirm preferred email & phone number, and discuss instructions and expectations.  I reminded Edward English to be prepared with any vital sign and/or heart rhythm information that could potentially be obtained via home monitoring, at the time of his visit. I reminded Edward English to expect a phone call at the time of his visit if his visit.  Baird Lyons, RN 04/24/2018 10:54 AM   INSTRUCTIONS FOR DOWNLOADING THE WEBEX APP TO SMARTPHONE  - If Apple, ask patient to go to App Store and type in WebEx in the search bar. Download [employer], the blue/green circle. If Android, go to [employer] and type in [employer] in the search bar. The app is free but as with any other app downloads, their phone may require them to verify saved payment information or Apple/Android password.  - The patient does NOT have to create an account. - On the day of the visit, the assist will walk the patient through joining the meeting with the meeting number/password.  INSTRUCTIONS FOR DOWNLOADING THE MYCHART APP TO SMARTPHONE  - The patient must first  make sure to have activated MyChart and know their login information - If Apple, go to [employer] and type in MyChart in the search bar and download the app. If Android, ask patient to go to [employer] and type in [place] in the search bar and download the app. The app is free but as with any other app downloads, their phone may require them to verify saved payment information or Apple/Android password.  - The patient will need to then log into the app with their MyChart username and password, and  select [facility] as their healthcare provider to link the account. When it is time for your visit, go to the MyChart app, find appointments, and click Begin Video Visit. Be sure to Select Allow for your device to access the Microphone and Camera for your visit. You will then be connected, and your provider will be with you shortly.  **If they have any issues connecting, or need assistance please contact MyChart service desk [phone])**  **If using a computer, in order to ensure the best quality for their visit they will need to use either of the following Internet Browsers: [employer], or Google Chrome**  IF USING DOXIMITY or DOXY.ME - The patient will receive a link just prior to their visit, either by text or email (to be determined day of appointment depending on if it's doxy.me or Doximity).     FULL LENGTH CONSENT FOR TELE-HEALTH VISIT   I hereby voluntarily request, consent and authorize [facility] and its employed or contracted physicians, physician assistants, nurse practitioners or other licensed health care professionals (the Practitioner), to provide me with telemedicine health care services (the "Services") as deemed necessary by the treating Practitioner. I acknowledge and consent to receive the Services by the Practitioner via telemedicine. I understand that the telemedicine visit will involve communicating with the Practitioner through live audiovisual communication technology and the disclosure of certain medical information by electronic transmission. I acknowledge that I have been given the opportunity to request an in-person assessment or other available alternative prior to the telemedicine visit and am voluntarily participating in the telemedicine visit.  I understand that I have the right to withhold or withdraw my consent to the use of telemedicine in the course of my care at any time, without affecting my right to future care or treatment, and that  the Practitioner or I may terminate the telemedicine visit at any time. I understand that I have the right to inspect all information obtained and/or recorded in the course of the telemedicine visit and may receive copies of available information for a reasonable fee.  I understand that some of the potential risks of receiving the Services via telemedicine include:  Marland Kitchen Delay or interruption in medical evaluation due to technological equipment failure or disruption; . Information transmitted may not be sufficient (e.g. poor resolution of images) to allow for appropriate medical decision making by the Practitioner; and/or  . In rare instances, security protocols could fail, causing a breach of personal health information.  Furthermore, I acknowledge that it is my responsibility to provide information about my medical history, conditions and care that is complete and accurate to the best of my ability. I acknowledge that Practitioner's advice, recommendations, and/or decision may be based on factors not within their control, such as incomplete or inaccurate data provided by me or distortions of diagnostic images or specimens that may result from electronic transmissions. I understand that the practice of medicine is not an  exact science and that Practitioner makes no warranties or guarantees regarding treatment outcomes. I acknowledge that I will receive a copy of this consent concurrently upon execution via email to the email address I last provided but may also request a printed copy by calling the office of [facility].    I understand that my insurance will be billed for this visit.   I have read or had this consent read to me. . I understand the contents of this consent, which adequately explains the benefits and risks of the Services being provided via telemedicine.  . I have been provided ample opportunity to ask questions regarding this consent and the Services and have had my questions answered to  my satisfaction. . I give my informed consent for the services to be provided through the use of telemedicine in my medical care  By participating in this telemedicine visit I agree to the above.

## 2018-04-25 ENCOUNTER — Encounter: Payer: Self-pay | Admitting: Cardiology

## 2018-04-25 NOTE — Progress Notes (Signed)
  Linq alert for Pause event 04/23/18 at [facility]. EGM appears SR w/ 3 second pause. Appt with Dr Curt Bears 4/20 - routed to him for review.

## 2018-04-27 ENCOUNTER — Other Ambulatory Visit: Payer: Self-pay

## 2018-04-27 ENCOUNTER — Encounter: Payer: Self-pay | Admitting: Cardiology

## 2018-04-27 ENCOUNTER — Telehealth (INDEPENDENT_AMBULATORY_CARE_PROVIDER_SITE_OTHER): Payer: Medicare HMO | Admitting: Cardiology

## 2018-04-27 DIAGNOSIS — R55 Syncope and collapse: Secondary | ICD-10-CM | POA: Diagnosis not present

## 2018-04-27 NOTE — Progress Notes (Signed)
Electrophysiology TeleHealth Note   Due to national recommendations of social distancing due to COVID 19, an audio/video telehealth visit is felt to be most appropriate for this patient at this time.  See Epic message for the patient's consent to telehealth for [facility].   Date:  04/27/2018   ID:  Edward English, DOB Jun 03, 1933, MRN [id number]  Location: patient's home  Provider location: [address] [place]  Evaluation Performed: Follow-up visit  PCP:  [facility]  Cardiologist:  Jenkins Rouge, MD  Electrophysiologist:  Dr Curt Bears  Chief Complaint:  syncope  History of Present Illness:    Edward English is a 85 y.o. male who presents via audio/video conferencing for a telehealth visit today.  Since last being seen in our clinic, the patient reports doing very well.  Today, he denies symptoms of palpitations, chest pain, shortness of breath,  lower extremity edema, dizziness, presyncope, or syncope.  The patient is otherwise without complaint today.  The patient denies symptoms of fevers, chills, cough, or new SOB worrisome for COVID 19.  Edward English is a 85 y.o. male who is being seen today for the evaluation of syncope at the request of Jenkins Rouge. Has a history of syncope.  S/p LINQ on 01/23/18.  Today, denies symptoms of palpitations, chest pain, shortness of breath, orthopnea, PND, lower extremity edema, claudication, dizziness, presyncope, syncope, bleeding, or neurologic sequela. The patient is tolerating medications without difficulties.  Overall he has been feeling well.  He has no chest pain or shortness of breath.  Is able to do all of his daily activities.  He is stopped his Flomax and started on Proscar for his BPH.  This is helped with his dizziness.  Past Medical History:  Diagnosis Date  . BENIGN PROSTATIC HYPERTROPHY, HX OF 02/17/2007   Qualifier: Diagnosis of  By: [place], [place]    .  DIVERTICULITIS-COLON 02/04/2008   Qualifier: Diagnosis of  By: Sharlett Iles MD Byrd Hesselbach ERECTILE DYSFUNCTION, MILD 02/17/2007   Qualifier: Diagnosis of  By: [place], [place]    . HYPERGLYCEMIA 02/17/2007   Qualifier: Diagnosis of  By: Danny Lawless CMA, [place]    . HYPERLIPIDEMIA 02/17/2007   Qualifier: Diagnosis of  By: Danny Lawless CMA, [place]    . HYPOGONADISM 02/17/2007   Qualifier: Diagnosis of  By: Danny Lawless CMA, [place]    . OVERACTIVE BLADDER 02/17/2007   Qualifier: Diagnosis of  By: Danny Lawless CMA, [place]    . PERIPHERAL NEUROPATHY 02/17/2007   Qualifier: Diagnosis of  By: [place], [place]    . PERIPHERAL VASCULAR DISEASE 02/17/2007   Qualifier: Diagnosis of  By: [place], [place]    . Regional enteritis of large intestine ([facility]) 06/23/2008   Qualifier: Diagnosis of  By: Sharlett Iles MD FACG, [place], HX OF 12/26/2009   Qualifier: Diagnosis of  By: Bullins CMA, [place]    . TONSILLECTOMY, HX OF 02/17/2007   Qualifier: Diagnosis of  By: [place], [place]      Past Surgical History:  Procedure Laterality Date  . cervical hemilaminectomy     C5-6, C6-7  . dupuytrens contracture relief    . Ectropion correction  2010   Left lower eye lid  . LOOP RECORDER INSERTION N/A 01/23/2018   Procedure: LOOP RECORDER INSERTION;  Surgeon: Constance Haw, MD;  Location: [facility];  Service: Cardiovascular;  Laterality: N/A;  . SEPTOPLASTY    .  SHOULDER ARTHROSCOPY    . TONSILLECTOMY      Current Outpatient Medications  Medication Sig Dispense Refill  . acetaminophen (TYLENOL) 500 MG tablet Take 1,000 mg by mouth every 6 (six) hours as needed for moderate pain or headache.     . finasteride (PROSCAR) 5 MG tablet Take 5 mg by mouth daily.    Marland Kitchen lovastatin (MEVACOR) 40 MG tablet Take 40 mg by mouth daily.    . metFORMIN (GLUMETZA) 500 MG (MOD) 24 hr tablet Take 500 mg by mouth daily with breakfast.    . Oxcarbazepine (TRILEPTAL) 300 MG tablet Take 150-300 mg by mouth See  admin instructions. Take 150 mg by mouth in the morning, take 150 mg by mouth in the afternoon and take 300 mg by mouth at bedtime    . tolterodine (DETROL) 2 MG tablet Take 2 mg by mouth 2 (two) times daily.     No current facility-administered medications for this visit.     Allergies:   Valdecoxib   Social History:  The patient  reports that he quit smoking about 29 years ago. He has never used smokeless tobacco. He reports current alcohol use. He reports that he does not use drugs.   Family History:  The patient's  family history includes Alzheimer's disease in his father; Cancer in his brother and father; Deep vein thrombosis in his brother; Diabetes in his mother; Heart attack in his mother; Heart disease in his mother.   ROS:  Please see the history of present illness.   All other systems are personally reviewed and negative.    Exam:    Vital Signs:  BP (!) 154/78   Pulse 69   Over the phone, no acute distress, no obvious shortness of breath   Labs/Other Tests and Data Reviewed:    Recent Labs: 11/15/2017: ALT 20; TSH 3.193 11/16/2017: BUN 15; Creatinine, Ser 1.02; Hemoglobin 12.3; Platelets 213; Potassium 3.9; Sodium 139   Wt Readings from Last 3 Encounters:  01/23/18 190 lb (86.2 kg)  01/15/18 198 lb (89.8 kg)  12/09/17 198 lb (89.8 kg)     Other studies personally reviewed: Additional studies/ records that were reviewed today include: ECG 01/15/18 personally reviewed  Review of the above records today demonstrates:   Sinus rhythm, rate 98   Last device remote is reviewed from [facility] PDF dated 04/25/18 which reveals normal device function, 3 second pause  Vs signal drop out   ASSESSMENT & PLAN:    1.  Syncope: s/p linq 01/23/18.  He has had no further episodes of syncope.  His physician has changed his prostate medicine and has taken him off of Flomax and put him on Proscar.  He has had much less dizziness since starting this medication.  He did have an episode of  a pause versus signal dropout on 416.  The patient was completely asymptomatic.  Due to that, we Zadin Lange continue to monitor.  His blood pressure readings have been elevated while at home.  They have been normal in clinic.  We Kaydynce Pat make no changes.  2. Hyperlipidemia: per PCP.   COVID 19 screen The patient denies symptoms of COVID 19 at this time.  The importance of social distancing was discussed today.  Follow-up: 9 months  Current medicines are reviewed at length with the patient today.   The patient does not have concerns regarding his medicines.  The following changes were made today:  none  Labs/ tests ordered today include:  No orders of  the defined types were placed in this encounter.  A video visit was not possible as the patient has no smart phone and is not able to access a video for his computer.  A telephone visit was thus used.  Patient Risk:  after full review of this patients clinical status, I feel that they are at moderate risk at this time.  Today, I have spent 12 minutes with the patient with telehealth technology discussing syncope, blood pressure.    Signed, Cassiopeia Florentino Meredith Leeds, MD  04/27/2018 11:22 AM     [facility] [address] [place] [address] [phone] (office) [phone] (fax)

## 2018-05-04 ENCOUNTER — Other Ambulatory Visit: Payer: Self-pay

## 2018-05-04 ENCOUNTER — Ambulatory Visit (INDEPENDENT_AMBULATORY_CARE_PROVIDER_SITE_OTHER): Payer: Medicare HMO | Admitting: *Deleted

## 2018-05-04 DIAGNOSIS — R55 Syncope and collapse: Secondary | ICD-10-CM

## 2018-05-04 DIAGNOSIS — I639 Cerebral infarction, unspecified: Secondary | ICD-10-CM

## 2018-05-04 LAB — CUP PACEART REMOTE DEVICE CHECK
Date Time Interrogation Session: 20200425033632
Implantable Pulse Generator Implant Date: 20200117

## 2018-05-12 NOTE — Progress Notes (Signed)
Carelink Summary Report / Loop Recorder 

## 2018-08-03 ENCOUNTER — Encounter: Payer: Medicare HMO | Admitting: *Deleted

## 2018-08-04 ENCOUNTER — Telehealth: Payer: Self-pay

## 2018-08-04 NOTE — Telephone Encounter (Signed)
Left message for patient to remind of missed remote transmission.  

## 2018-08-28 ENCOUNTER — Telehealth: Payer: Self-pay | Admitting: Nurse Practitioner

## 2018-08-28 NOTE — Telephone Encounter (Signed)
Spoke w/ pt and informed him that the letter was mailed by mistake that his remote transmission was received and his monitor is updating appropriately. He has a [employer] ILR.

## 2018-08-28 NOTE — Telephone Encounter (Signed)
Per pt call he received a letter about not doing a transmission and would like a call back to help him with this please.

## 2018-10-30 ENCOUNTER — Ambulatory Visit (INDEPENDENT_AMBULATORY_CARE_PROVIDER_SITE_OTHER): Payer: Medicare HMO | Admitting: *Deleted

## 2018-10-30 DIAGNOSIS — R55 Syncope and collapse: Secondary | ICD-10-CM

## 2018-11-01 LAB — CUP PACEART REMOTE DEVICE CHECK
Date Time Interrogation Session: 20201024043639
Implantable Pulse Generator Implant Date: 20200117

## 2018-11-09 NOTE — Progress Notes (Signed)
Carelink Summary Report / Loop Recorder 

## 2018-11-10 ENCOUNTER — Ambulatory Visit
Admission: RE | Admit: 2018-11-10 | Discharge: 2018-11-10 | Disposition: A | Discharge status: Home / Self Care | Payer: Medicare HMO | Source: Ambulatory Visit | Attending: Nurse Practitioner | Admitting: Nurse Practitioner

## 2018-11-10 ENCOUNTER — Other Ambulatory Visit: Payer: Self-pay | Admitting: Nurse Practitioner

## 2018-11-10 DIAGNOSIS — R0989 Other specified symptoms and signs involving the circulatory and respiratory systems: Secondary | ICD-10-CM

## 2018-11-10 DIAGNOSIS — R0781 Pleurodynia: Secondary | ICD-10-CM

## 2018-11-10 DIAGNOSIS — R059 Cough, unspecified: Secondary | ICD-10-CM

## 2018-11-10 DIAGNOSIS — R0789 Other chest pain: Secondary | ICD-10-CM

## 2018-11-10 DIAGNOSIS — R05 Cough: Secondary | ICD-10-CM

## 2018-12-24 ENCOUNTER — Ambulatory Visit: Payer: Medicare HMO | Attending: Internal Medicine

## 2018-12-24 DIAGNOSIS — Z20822 Contact with and (suspected) exposure to covid-19: Secondary | ICD-10-CM

## 2018-12-26 LAB — NOVEL CORONAVIRUS, NAA: SARS-CoV-2, NAA: NOT DETECTED

## 2019-02-01 ENCOUNTER — Ambulatory Visit (INDEPENDENT_AMBULATORY_CARE_PROVIDER_SITE_OTHER): Payer: Medicare HMO | Admitting: *Deleted

## 2019-02-01 DIAGNOSIS — R55 Syncope and collapse: Secondary | ICD-10-CM | POA: Diagnosis not present

## 2019-02-02 LAB — CUP PACEART REMOTE DEVICE CHECK
Date Time Interrogation Session: 20210122234111
Implantable Pulse Generator Implant Date: 20200117

## 2019-02-22 ENCOUNTER — Other Ambulatory Visit: Payer: Self-pay

## 2019-02-22 ENCOUNTER — Encounter: Payer: Self-pay | Admitting: Cardiology

## 2019-02-22 ENCOUNTER — Ambulatory Visit: Payer: Medicare HMO | Admitting: Cardiology

## 2019-02-22 VITALS — BP 128/64 | HR 90 | Ht 69.0 in | Wt 197.0 lb

## 2019-02-22 DIAGNOSIS — R55 Syncope and collapse: Secondary | ICD-10-CM | POA: Diagnosis not present

## 2019-02-22 NOTE — Progress Notes (Signed)
Electrophysiology Office Note   Date:  02/22/2019   ID:  Edward English, DOB [birth year], MRN [id number]  PCP:  [facility]  Cardiologist:  Johnsie Cancel Primary Electrophysiologist:  Jennings Corado Meredith Leeds, MD    No chief complaint on file.    History of Present Illness: Edward English is a 86 y.o. male who is being seen today for the evaluation of syncope at the request of [place]. Presenting today for electrophysiology evaluation.  He was initially admitted to the hospital on 11/15/2017 with orthostatic symptoms and possible syncope.  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  He had no trauma.  He was given a saline bolus in the emergency room which got his blood pressure to 114/72.  He did lose bladder control.  He has had similar symptoms in the past.  Cardiac work-up has been negative in the past with no arrhythmia or structural heart problems.  He is off his Cymbalta and Flomax which is helped with his dizziness.  Prior to his episodes of syncope, he feels weak, fatigued.  He has diaphoresis.  This is not associated with changing positions.  Today, denies symptoms of palpitations, chest pain, shortness of breath, orthopnea, PND, lower extremity edema, claudication, dizziness, presyncope, syncope, bleeding, or neurologic sequela. The patient is tolerating medications without difficulties.  Overall he has been feeling well.  He has had no further episodes of syncope.  Unfortunately he is having issues with allergies being worked up by his pulmonologist.   Past Medical History:  Diagnosis Date  . BENIGN PROSTATIC HYPERTROPHY, HX OF 02/17/2007   Qualifier: Diagnosis of  By: [place], [place]    . DIVERTICULITIS-COLON 02/04/2008   Qualifier: Diagnosis of  By: Sharlett Iles MD Byrd Hesselbach ERECTILE DYSFUNCTION, MILD 02/17/2007   Qualifier: Diagnosis of  By: [place], [place]    . HYPERGLYCEMIA 02/17/2007   Qualifier: Diagnosis of  By: Danny Lawless  CMA, [place]    . HYPERLIPIDEMIA 02/17/2007   Qualifier: Diagnosis of  By: Danny Lawless CMA, [place]    . HYPOGONADISM 02/17/2007   Qualifier: Diagnosis of  By: Danny Lawless CMA, [place]    . OVERACTIVE BLADDER 02/17/2007   Qualifier: Diagnosis of  By: Danny Lawless CMA, [place]    . PERIPHERAL NEUROPATHY 02/17/2007   Qualifier: Diagnosis of  By: [place], [place]    . PERIPHERAL VASCULAR DISEASE 02/17/2007   Qualifier: Diagnosis of  By: [place], [place]    . Regional enteritis of large intestine ([place]) 06/23/2008   Qualifier: Diagnosis of  By: Sharlett Iles MD FACG, [place], HX OF 12/26/2009   Qualifier: Diagnosis of  By: Bullins CMA, [place]    . TONSILLECTOMY, HX OF 02/17/2007   Qualifier: Diagnosis of  By: [place], [place]     Past Surgical History:  Procedure Laterality Date  . cervical hemilaminectomy     C5-6, C6-7  . dupuytrens contracture relief    . Ectropion correction  2010   Left lower eye lid  . LOOP RECORDER INSERTION N/A 01/23/2018   Procedure: LOOP RECORDER INSERTION;  Surgeon: Constance Haw, MD;  Location: [facility];  Service: Cardiovascular;  Laterality: N/A;  . SEPTOPLASTY    . SHOULDER ARTHROSCOPY    . TONSILLECTOMY       Current Outpatient Medications  Medication Sig Dispense Refill  . acetaminophen (TYLENOL) 500 MG tablet Take 1,000 mg by mouth every 6 (six) hours as  needed for moderate pain or headache.     . finasteride (PROSCAR) 5 MG tablet Take 5 mg by mouth daily.    Marland Kitchen loratadine (CLARITIN) 10 MG tablet Take 10 mg by mouth daily.    Marland Kitchen lovastatin (MEVACOR) 40 MG tablet Take 40 mg by mouth daily.    . metFORMIN (GLUMETZA) 500 MG (MOD) 24 hr tablet Take 500 mg by mouth daily with breakfast.    . montelukast (SINGULAIR) 10 MG tablet Take 10 mg by mouth daily.    . Oxcarbazepine (TRILEPTAL) 300 MG tablet Take 150-300 mg by mouth See admin instructions. Take 150 mg by mouth in the morning, take 150 mg by mouth in the afternoon and take 300 mg by  mouth at bedtime    . SYMBICORT 160-4.5 MCG/ACT inhaler Inhale 2 puffs into the lungs 2 (two) times daily.    Marland Kitchen tolterodine (DETROL) 2 MG tablet Take 2 mg by mouth 2 (two) times daily.     No current facility-administered medications for this visit.    Allergies:   Bee venom and Valdecoxib   Social History:  The patient  reports that he quit smoking about 30 years ago. He has never used smokeless tobacco. He reports current alcohol use. He reports that he does not use drugs.   Family History:  The patient's family history includes Alzheimer's disease in his father; Cancer in his brother and father; Deep vein thrombosis in his brother; Diabetes in his mother; Heart attack in his mother; Heart disease in his mother.    ROS:  Please see the history of present illness.   Otherwise, review of systems is positive for none.   All other systems are reviewed and negative.   PHYSICAL EXAM: VS:  BP 128/64   Pulse 90   Ht 5' 9"  (1.753 m)   Wt 197 lb (89.4 kg)   BMI 29.09 kg/m  , BMI Body mass index is 29.09 kg/m. GEN: Well nourished, well developed, in no acute distress  HEENT: normal  Neck: no JVD, carotid bruits, or masses Cardiac: RRR; no murmurs, rubs, or gallops,no edema  Respiratory:  clear to auscultation bilaterally, normal work of breathing GI: soft, nontender, nondistended, + BS MS: no deformity or atrophy  Skin: warm and dry, device site well healed Neuro:  Strength and sensation are intact Psych: euthymic mood, full affect  EKG:  EKG is ordered today. Personal review of the ekg ordered shows sinus rhythm, rate 90  Personal review of the device interrogation today. Results in [place]: No results found for requested labs within last 8760 hours.    Lipid Panel     Component Value Date/Time   CHOL 177 11/15/2017 1901   TRIG 141 11/15/2017 1901   HDL 33 (L) 11/15/2017 1901   CHOLHDL 5.4 11/15/2017 1901   VLDL 28 11/15/2017 1901   LDLCALC 116 (H) 11/15/2017  1901   LDLDIRECT 97.2 11/30/2013 1336     Wt Readings from Last 3 Encounters:  02/22/19 197 lb (89.4 kg)  01/23/18 190 lb (86.2 kg)  01/15/18 198 lb (89.8 kg)      Other studies Reviewed: Additional studies/ records that were reviewed today include: TTE 11/15/17  Review of the above records today demonstrates:  - Left ventricle: The cavity size was normal. Wall thickness was   increased in a pattern of mild LVH. Systolic function was normal.   The estimated ejection fraction was 55%. Although no diagnostic   regional wall motion  abnormality was identified, this possibility   cannot be completely excluded on the basis of this study. Doppler   parameters are consistent with abnormal left ventricular   relaxation (grade 1 diastolic dysfunction). - Aortic valve: Mildly calcified annulus. Trileaflet; mildly   calcified leaflets. There was trivial regurgitation. - Mitral valve: Mildly calcified annulus. There was trivial   regurgitation. - Right atrium: Central venous pressure (est): 3 mm Hg. - Atrial septum: No defect or patent foramen ovale was identified. - Tricuspid valve: There was mild regurgitation. - Pulmonary arteries: PA peak pressure: 23 mm Hg (S). - Pericardium, extracardiac: There was no pericardial effusion.   ASSESSMENT AND PLAN:  1.  Syncope: Linq monitor implanted 01/23/2018.  Fortunately had no further episodes of syncope.  We Nikyla Navedo continue with Linq monitoring.  No other changes.  2.  Hyperlipidemia: Continue lovastatin.   Current medicines are reviewed at length with the patient today.   The patient does not have concerns regarding his medicines.  The following changes were made today: None  Labs/ tests ordered today include:  Orders Placed This Encounter  Procedures  . EKG 12-Lead     Disposition:   FU with Parthiv Mucci pending link monitor results  Signed, Dyneisha Murchison Meredith Leeds, MD  02/22/2019 3:02 PM     [facility] [address] [place] [address] [phone] (office) [phone] (fax)

## 2019-05-03 ENCOUNTER — Ambulatory Visit (INDEPENDENT_AMBULATORY_CARE_PROVIDER_SITE_OTHER): Payer: Medicare HMO | Admitting: *Deleted

## 2019-05-03 DIAGNOSIS — R55 Syncope and collapse: Secondary | ICD-10-CM

## 2019-05-03 LAB — CUP PACEART REMOTE DEVICE CHECK
Date Time Interrogation Session: 20210425233812
Implantable Pulse Generator Implant Date: 20200117

## 2019-05-03 NOTE — Progress Notes (Signed)
ILR Remote 

## 2019-05-18 ENCOUNTER — Telehealth: Payer: Self-pay

## 2019-05-18 NOTE — Telephone Encounter (Signed)
Linq alert received- Pause event, possible undersensing.  Linq implant due to syncope.  Pt has had similar undersensed episodes in past.  Pt seen in February, no changes made to detection.      Attempted to reach pt to determine if symptoms occurred during event.  No answer, DPR on file.  Left detailed message requesting pt callback.

## 2019-05-20 NOTE — Telephone Encounter (Signed)
Patient returned phone call.  He does not recall any symptoms or what he might hav e been doing at time of event.  Event does appear to be undersensed as it has been in the past  Will continue to monitor.

## 2019-06-15 ENCOUNTER — Telehealth: Payer: Self-pay | Admitting: *Deleted

## 2019-06-15 NOTE — Telephone Encounter (Signed)
LMOM requesting call back to DC. Direct number and office hours provided.  Pause episodes noted on LINQ that show undersensing. Discussed with Dr. Curt Bears, received order to reprogram pause detection to >4.5 sec and increase RV sensitivity if possible. Will make patient aware and offer to schedule DC appointment for [facility] reprogramming.

## 2019-06-16 NOTE — Telephone Encounter (Signed)
Patient made aware of need to reprogram LINQ. Scheduled for appointment on 06/24/19 in DC to reprogram pause detection to >4.5 seconds and increase RV sensativity if possible.

## 2019-06-24 ENCOUNTER — Other Ambulatory Visit: Payer: Self-pay

## 2019-06-24 ENCOUNTER — Telehealth: Payer: Self-pay

## 2019-06-24 ENCOUNTER — Ambulatory Visit (INDEPENDENT_AMBULATORY_CARE_PROVIDER_SITE_OTHER): Payer: Medicare HMO | Admitting: Emergency Medicine

## 2019-06-24 DIAGNOSIS — R55 Syncope and collapse: Secondary | ICD-10-CM

## 2019-06-24 NOTE — Telephone Encounter (Signed)
Spoke with pt and thanked him for sending a manual transmission. Carelink now up to date with reprogramming from DC session earlier today. Will continue to monitor for true/actionable pause episodes. Educated pt to call the DC for any presyncopal/syncopal episodes, especially if he is not seeking emergency medical attention. Pt verbalizes understanding and agreement with plan.

## 2019-06-24 NOTE — Patient Instructions (Signed)
Call office when you return home to get assistance sending a manual transmission from your home monitor. You will need to be in the room with the monitor when you send the transmission.

## 2019-06-24 NOTE — Telephone Encounter (Signed)
The pt states he got his loop adjusted this morning and was told to send in a manual transmission. Transmission received. I told him the nurse will review it and give him a call back.

## 2019-07-06 LAB — CUP PACEART INCLINIC DEVICE CHECK
Date Time Interrogation Session: 20210617113900
Implantable Pulse Generator Implant Date: 20200117

## 2019-07-06 NOTE — Progress Notes (Signed)
Loop check in clinic. Battery status: Good. R-waves 0.32 mV. 0 symptom episodes, 0 tachy episodes, 15 pause episodes that are false ,  pause duration programmed to 4.5 seconds per Dr Rayann Heman, 1 brady episode that was false r/t undersensing. 0 AF episodes (0% burden). Monthly summary reports and ROV with WC prn.

## 2019-08-02 ENCOUNTER — Ambulatory Visit (INDEPENDENT_AMBULATORY_CARE_PROVIDER_SITE_OTHER): Payer: Medicare HMO | Admitting: *Deleted

## 2019-08-02 DIAGNOSIS — R55 Syncope and collapse: Secondary | ICD-10-CM

## 2019-08-03 LAB — CUP PACEART REMOTE DEVICE CHECK
Date Time Interrogation Session: 20210725233236
Implantable Pulse Generator Implant Date: 20200117

## 2019-08-05 NOTE — Progress Notes (Signed)
Carelink Summary Report / Loop Recorder 

## 2019-08-16 ENCOUNTER — Telehealth: Payer: Self-pay

## 2019-08-16 NOTE — Telephone Encounter (Signed)
Carelink alert- event report - brady episode due to undersensing. Counters show 1 new pause episode, no ecg.  Spoke with pt and assisted with manual transmission.    Transmission received, appears to be undersensing.  Pt implanted for syncope.  He denies any syncope occurring at time of episode.

## 2019-11-01 ENCOUNTER — Ambulatory Visit (INDEPENDENT_AMBULATORY_CARE_PROVIDER_SITE_OTHER): Payer: Medicare HMO

## 2019-11-01 DIAGNOSIS — R55 Syncope and collapse: Secondary | ICD-10-CM

## 2019-11-03 LAB — CUP PACEART REMOTE DEVICE CHECK
Date Time Interrogation Session: 20211024234350
Implantable Pulse Generator Implant Date: 20200117

## 2019-11-04 NOTE — Progress Notes (Signed)
Carelink Summary Report / Loop Recorder 

## 2020-01-31 ENCOUNTER — Ambulatory Visit (INDEPENDENT_AMBULATORY_CARE_PROVIDER_SITE_OTHER): Payer: Medicare HMO

## 2020-01-31 DIAGNOSIS — R55 Syncope and collapse: Secondary | ICD-10-CM | POA: Diagnosis not present

## 2020-01-31 LAB — CUP PACEART REMOTE DEVICE CHECK
Date Time Interrogation Session: 20220123225634
Implantable Pulse Generator Implant Date: 20200117

## 2020-02-07 ENCOUNTER — Telehealth: Payer: Self-pay | Admitting: Student

## 2020-02-07 NOTE — Telephone Encounter (Signed)
Alert for short ST/ vs SVT as below.   Pt denies symptoms. States he was likely asleep given the time in question.   He will call if he has any symptoms of lightheadedness, dizziness, heart racing, near syncope, or syncope.   Legrand Como [address] [place], PA-C  02/07/2020 11:22 AM

## 2020-02-11 NOTE — Progress Notes (Signed)
Carelink Summary Report / Loop Recorder 

## 2020-05-01 ENCOUNTER — Ambulatory Visit (INDEPENDENT_AMBULATORY_CARE_PROVIDER_SITE_OTHER): Payer: Medicare HMO

## 2020-05-01 DIAGNOSIS — R55 Syncope and collapse: Secondary | ICD-10-CM | POA: Diagnosis not present

## 2020-05-02 LAB — CUP PACEART REMOTE DEVICE CHECK
Date Time Interrogation Session: 20220425000326
Implantable Pulse Generator Implant Date: 20200117

## 2020-05-08 ENCOUNTER — Other Ambulatory Visit: Payer: Self-pay

## 2020-05-08 ENCOUNTER — Encounter (HOSPITAL_COMMUNITY): Payer: Self-pay

## 2020-05-08 ENCOUNTER — Ambulatory Visit (HOSPITAL_COMMUNITY)
Admission: EM | Admit: 2020-05-08 | Discharge: 2020-05-08 | Disposition: A | Discharge status: Home / Self Care | Payer: Medicare HMO | Attending: Physician Assistant | Admitting: Physician Assistant

## 2020-05-08 DIAGNOSIS — J441 Chronic obstructive pulmonary disease with (acute) exacerbation: Secondary | ICD-10-CM

## 2020-05-08 DIAGNOSIS — U071 COVID-19: Secondary | ICD-10-CM | POA: Diagnosis not present

## 2020-05-08 DIAGNOSIS — R058 Other specified cough: Secondary | ICD-10-CM

## 2020-05-08 MED ORDER — PREDNISONE 10 MG PO TABS: 20.0000 mg | ORAL_TABLET | Freq: Every day | ORAL | 0 refills | Status: AC

## 2020-05-08 MED ORDER — DOXYCYCLINE HYCLATE 100 MG PO CAPS: 100.0000 mg | ORAL_CAPSULE | Freq: Two times a day (BID) | ORAL | 0 refills | Status: DC

## 2020-05-08 MED ORDER — ACETAMINOPHEN 325 MG PO TABS
ORAL_TABLET | ORAL | Status: AC
  Filled 2020-05-08: qty 3

## 2020-05-08 MED ORDER — ACETAMINOPHEN 325 MG PO TABS
975.0000 mg | ORAL_TABLET | Freq: Once | ORAL | Status: AC
  Administered 2020-05-08: 975 mg via ORAL

## 2020-05-08 NOTE — ED Provider Notes (Signed)
[facility]    CSN: [id number] Arrival date & time: 05/08/20  1929      History   Chief Complaint Chief Complaint  Patient presents with  . Covid Positive  . Cough    HPI RAJAN BURGARD is a 87 y.o. male.   Patient presents today with a 1 week history of productive cough.  Reports associated fever, left otalgia, increased feeding production, sinus pressure.  Denies any shortness of breath or chest pain.  He has tried NyQuil without improvement of symptoms.  He took an at-home COVID test few days ago that was positive.  He denies any known sick contacts.  He is up-to-date on COVID-19 vaccination including booster.  Has not had flu shot.  Denies any recent antibiotic use.  He is a former smoker with quit date more than 30 years ago.  He does have a history of COPD managed with Symbicort and albuterol; he is followed by pulmonology.  Denies having to use albuterol more often since symptoms began.       Past Medical History:  Diagnosis Date  . BENIGN PROSTATIC HYPERTROPHY, HX OF 02/17/2007   Qualifier: Diagnosis of  By: [place], [place]    . DIVERTICULITIS-COLON 02/04/2008   Qualifier: Diagnosis of  By: Sharlett Iles MD Byrd Hesselbach ERECTILE DYSFUNCTION, MILD 02/17/2007   Qualifier: Diagnosis of  By: [place], [place]    . HYPERGLYCEMIA 02/17/2007   Qualifier: Diagnosis of  By: Danny Lawless CMA, [place]    . HYPERLIPIDEMIA 02/17/2007   Qualifier: Diagnosis of  By: Danny Lawless CMA, [place]    . HYPOGONADISM 02/17/2007   Qualifier: Diagnosis of  By: Danny Lawless CMA, [place]    . OVERACTIVE BLADDER 02/17/2007   Qualifier: Diagnosis of  By: Danny Lawless CMA, [place]    . PERIPHERAL NEUROPATHY 02/17/2007   Qualifier: Diagnosis of  By: [place], [place]    . PERIPHERAL VASCULAR DISEASE 02/17/2007   Qualifier: Diagnosis of  By: [place], [place]    . Regional enteritis of large intestine ([place]) 06/23/2008   Qualifier: Diagnosis of  By: Sharlett Iles MD FACG, [place], HX OF  12/26/2009   Qualifier: Diagnosis of  By: Bullins CMA, [place]    . TONSILLECTOMY, HX OF 02/17/2007   Qualifier: Diagnosis of  By: Danny Lawless CMA, [place]      Patient Active Problem List   Diagnosis Date Noted  . Syncope and collapse 11/15/2017  . Abnormal LFTs 07/02/2017  . Renal insufficiency 07/02/2017  . Hereditary and idiopathic peripheral neuropathy 04/26/2015  . Right-sided low back pain with right-sided sciatica 04/26/2015  . History of BPH 06/21/2014  . Arthritis of right lower extremity 05/04/2014  . Low serum testosterone level 05/23/2013  . Gynecomastia, male 05/14/2013  . Benign prostate hyperplasia 01/10/2012  . Borderline diabetic 12/20/2011  . Routine health maintenance 06/30/2011  . Increased frequency of urination 03/27/2011  . TOBACCO ABUSE, HX OF 12/26/2009  . REGIONAL ENTERITIS OF LARGE INTESTINE 06/23/2008  . Mixed hyperlipidemia 02/17/2007  . Spinal stenosis of lumbar region 02/17/2007  . OVERACTIVE BLADDER 02/17/2007  . Elevated blood sugar 02/17/2007    Past Surgical History:  Procedure Laterality Date  . cervical hemilaminectomy     C5-6, C6-7  . dupuytrens contracture relief    . Ectropion correction  2010   Left lower eye lid  . LOOP RECORDER INSERTION N/A 01/23/2018   Procedure: LOOP RECORDER INSERTION;  Surgeon: Constance Haw, MD;  Location: [facility];  Service: Cardiovascular;  Laterality: N/A;  . SEPTOPLASTY    . SHOULDER ARTHROSCOPY    . TONSILLECTOMY         Home Medications    Prior to Admission medications   Medication Sig Start Date End Date Taking? Authorizing Provider  doxycycline (VIBRAMYCIN) 100 MG capsule Take 1 capsule (100 mg total) by mouth 2 (two) times daily. 05/08/20  Yes Josecarlos Harriott K, PA-C  predniSONE (DELTASONE) 10 MG tablet Take 2 tablets (20 mg total) by mouth daily for 4 days. 05/08/20 05/12/20 Yes Dannica Bickham, Derry Skill, PA-C  acetaminophen (TYLENOL) 500 MG tablet Take 1,000 mg by mouth every 6 (six) hours as needed  for moderate pain or headache.     [provider]  finasteride (PROSCAR) 5 MG tablet Take 5 mg by mouth daily.    [provider]  loratadine (CLARITIN) 10 MG tablet Take 10 mg by mouth daily. 02/16/19   [provider]  lovastatin (MEVACOR) 40 MG tablet Take 40 mg by mouth daily.    [provider]  metFORMIN (GLUMETZA) 500 MG (MOD) 24 hr tablet Take 500 mg by mouth daily with breakfast.    [provider]  montelukast (SINGULAIR) 10 MG tablet Take 10 mg by mouth daily. 09/09/18   [provider]  Oxcarbazepine (TRILEPTAL) 300 MG tablet Take 150-300 mg by mouth See admin instructions. Take 150 mg by mouth in the morning, take 150 mg by mouth in the afternoon and take 300 mg by mouth at bedtime    [provider]  SYMBICORT 160-4.5 MCG/ACT inhaler Inhale 2 puffs into the lungs 2 (two) times daily. 02/01/19   [provider]  tolterodine (DETROL) 2 MG tablet Take 2 mg by mouth 2 (two) times daily.    [provider]    Family History Family History  Problem Relation Age of Onset  . Diabetes Mother   . Heart attack Mother   . Heart disease Mother   . Alzheimer's disease Father   . Cancer Father   . Deep vein thrombosis Brother   . Cancer Brother   . Colon cancer Neg Hx   . Prostate cancer Neg Hx   . Stroke Neg Hx   . Coronary artery disease Neg Hx     Social History Social History   Tobacco Use  . Smoking status: Former Smoker    Quit date: 11/22/1988    Years since quitting: 31.4  . Smokeless tobacco: Never Used  Vaping Use  . Vaping Use: Never used  Substance Use Topics  . Alcohol use: Yes    Alcohol/week: 0.0 standard drinks    Comment: 1 beer/month  . Drug use: No     Allergies   Bee venom and Valdecoxib   Review of Systems Review of Systems  Constitutional: Positive for fatigue and fever. Negative for activity change and appetite change.  HENT: Positive for congestion. Negative for  sinus pressure, sneezing and sore throat.   Respiratory: Positive for cough. Negative for shortness of breath.   Cardiovascular: Negative for chest pain.  Gastrointestinal: Negative for abdominal pain, diarrhea, nausea and vomiting.  Musculoskeletal: Negative for arthralgias and myalgias.  Neurological: Positive for headaches. Negative for dizziness and light-headedness.     Physical Exam Triage Vital Signs ED Triage Vitals  Enc Vitals Group     BP 05/08/20 1956 135/60     Pulse Rate 05/08/20 1956 (!) 105     Resp 05/08/20 1956 19  Temp 05/08/20 1956 (!) 101.1 F (38.4 C)     Temp src --      SpO2 05/08/20 1956 98 %     Weight --      Height --      Head Circumference --      Peak Flow --      Pain Score 05/08/20 1955 0     Pain Loc --      Pain Edu? --      Excl. in [place]? --    No data found.  Updated Vital Signs BP (!) 104/54 (BP Location: Right Arm)   Pulse 95   Temp (!) 101.4 F (38.6 C) (Oral)   Resp (!) 22   SpO2 92%   Visual Acuity Right Eye Distance:   Left Eye Distance:   Bilateral Distance:    Right Eye Near:   Left Eye Near:    Bilateral Near:     Physical Exam Vitals reviewed.  Constitutional:      General: He is awake.     Appearance: Normal appearance. He is normal weight. He is not ill-appearing.     Comments: Very pleasant male appears stated age in no acute distress sitting comfortably in exam room  HENT:     Head: Normocephalic and atraumatic.     Right Ear: Tympanic membrane, ear canal and external ear normal. Tympanic membrane is not erythematous or bulging.     Left Ear: Ear canal and external ear normal. Tympanic membrane is erythematous and retracted.     Nose: Nose normal.     Mouth/Throat:     Pharynx: Uvula midline. No oropharyngeal exudate or posterior oropharyngeal erythema.  Cardiovascular:     Rate and Rhythm: Normal rate and regular rhythm.     Heart sounds: No murmur heard.   Pulmonary:     Effort: Pulmonary effort is  normal. No accessory muscle usage or respiratory distress.     Breath sounds: Normal breath sounds. No stridor. No wheezing, rhonchi or rales.     Comments: Clear to auscultation bilaterally Abdominal:     General: Bowel sounds are normal.     Palpations: Abdomen is soft.     Tenderness: There is no abdominal tenderness.  Lymphadenopathy:     Head:     Right side of head: No submental, submandibular or tonsillar adenopathy.     Left side of head: No submental, submandibular or tonsillar adenopathy.     Cervical: No cervical adenopathy.  Neurological:     Mental Status: He is alert.  Psychiatric:        Behavior: Behavior is cooperative.      UC Treatments / Results  Labs (all labs ordered are listed, but only abnormal results are displayed) Labs Reviewed - No data to display  EKG   Radiology No results found.  Procedures Procedures (including critical care time)  Medications Ordered in UC Medications  acetaminophen (TYLENOL) tablet 975 mg (975 mg Oral Given 05/08/20 2003)    Initial Impression / Assessment and Plan / UC Course  I have reviewed the triage vital signs and the nursing notes.  Pertinent labs & imaging results that were available during my care of the patient were reviewed by me and considered in my medical decision making (see chart for details).     Concern for COPD exacerbation given increased sputum production.  No indication for COVID-19 testing given patient has has at home positive test and has been symptomatic for 1 week.  Discussed that he is outside of the timeframe for initiating COVID-19 treatment.  We will start doxycycline to cover for COPD exacerbation with instruction to stay out of sun due to photosensitivity associated with this medication.  Will provide prednisone burst (20 mg x 4 days) with instruction to take additional NSAIDs.  He was encouraged to limit carbohydrates and drink plenty of fluid.  He is to continue using COPD inhalers as  prescribed to pulmonology.  Strict instruction to go directly to the emergency room with any worsening symptoms including shortness of breath or chest pain to which patient expressed understanding.  Addendum: Patient had worsening vital signs including increasing fever despite Tylenol, decrease in blood pressure, tachypnea, decreased oxygen saturation.  Given abnormal vital signs patient was encouraged to go to the emergency room for further evaluation and management to which she expressed understanding.  Vital signs stable at the time of discharge and patient will go directly to ER following visit via private vehicle.  Final Clinical Impressions(s) / UC Diagnoses   Final diagnoses:  COPD exacerbation ([place])  COVID-19  Productive cough     Discharge Instructions     You are outside of the window for COVID-19 treatment.  I am concerned that this is exacerbated your COPD.  Take doxycycline 100 mg twice daily for 10 days.  Stay out of the sun while on this medication.  Take 20 mg of prednisone for 4 days.  This can raise your blood sugar so please make sure that you limit carbohydrates and drink plenty of fluid.  If your blood sugar becomes significantly elevated you need to be reevaluated.  If you have any worsening symptoms including shortness of breath, high fever not responding to medication, chest pain you need to go to the emergency room immediately.    ED Prescriptions    Medication Sig Dispense Auth. Provider   predniSONE (DELTASONE) 10 MG tablet Take 2 tablets (20 mg total) by mouth daily for 4 days. 8 tablet Hoby Kawai K, PA-C   doxycycline (VIBRAMYCIN) 100 MG capsule Take 1 capsule (100 mg total) by mouth 2 (two) times daily. 20 capsule Nirvaan Frett, Derry Skill, PA-C     PDMP not reviewed this encounter.   Terrilee Croak, PA-C 05/08/20 2111

## 2020-05-08 NOTE — ED Triage Notes (Addendum)
Pt presents with complaints of runny nose, cough, and chest congestion x 2 days. Patient is covid positive. Pt also endorses feeling "jerky" this afternoon.

## 2020-05-08 NOTE — Discharge Instructions (Addendum)
You are outside of the window for COVID-19 treatment.  I am concerned that this is exacerbated your COPD.  Take doxycycline 100 mg twice daily for 10 days.  Stay out of the sun while on this medication.  Take 20 mg of prednisone for 4 days.  This can raise your blood sugar so please make sure that you limit carbohydrates and drink plenty of fluid.  If your blood sugar becomes significantly elevated you need to be reevaluated.  If you have any worsening symptoms including shortness of breath, high fever not responding to medication, chest pain you need to go to the emergency room immediately.

## 2020-05-11 ENCOUNTER — Encounter (HOSPITAL_COMMUNITY): Payer: Self-pay | Admitting: Emergency Medicine

## 2020-05-11 ENCOUNTER — Ambulatory Visit (HOSPITAL_COMMUNITY)
Admission: EM | Admit: 2020-05-11 | Discharge: 2020-05-11 | Disposition: A | Discharge status: Home / Self Care | Payer: Medicare HMO | Attending: Internal Medicine | Admitting: Internal Medicine

## 2020-05-11 DIAGNOSIS — J441 Chronic obstructive pulmonary disease with (acute) exacerbation: Secondary | ICD-10-CM

## 2020-05-11 DIAGNOSIS — U071 COVID-19: Secondary | ICD-10-CM | POA: Diagnosis not present

## 2020-05-11 NOTE — ED Triage Notes (Signed)
Pt presents today with c/o of nasal congestion and cough x 4 days. He reports taking a home covid test which was positive.

## 2020-05-11 NOTE — Discharge Instructions (Signed)
Please continue current medication regimen Return to urgent care if you have worsening shortness of breath.

## 2020-05-14 NOTE — ED Provider Notes (Addendum)
[facility]    CSN: [id number] Arrival date & time: 05/11/20  1609      History   Chief Complaint Chief Complaint  Patient presents with  . Nasal Congestion  . Cough    HPI Edward English is a 87 y.o. male comes to the urgent care 3 days after he was diagnosed with COVID-19 infection and COPD exacerbation.  Patient is improving on the current medication regimen.  Patient denies any worsening shortness of breath.  Cough is improved.  No febrile episodes.  Patient repeated his COVID test which turned out positive.  He came back to be reevaluated to make sure that he is improving.  He is fully vaccinated against COVID-19 virus.   HPI  Past Medical History:  Diagnosis Date  . BENIGN PROSTATIC HYPERTROPHY, HX OF 02/17/2007   Qualifier: Diagnosis of  By: [place], [place]    . DIVERTICULITIS-COLON 02/04/2008   Qualifier: Diagnosis of  By: Sharlett Iles MD Byrd Hesselbach ERECTILE DYSFUNCTION, MILD 02/17/2007   Qualifier: Diagnosis of  By: [place], [place]    . HYPERGLYCEMIA 02/17/2007   Qualifier: Diagnosis of  By: Danny Lawless CMA, [place]    . HYPERLIPIDEMIA 02/17/2007   Qualifier: Diagnosis of  By: Danny Lawless CMA, [place]    . HYPOGONADISM 02/17/2007   Qualifier: Diagnosis of  By: Danny Lawless CMA, [place]    . OVERACTIVE BLADDER 02/17/2007   Qualifier: Diagnosis of  By: Danny Lawless CMA, [place]    . PERIPHERAL NEUROPATHY 02/17/2007   Qualifier: Diagnosis of  By: [place], [place]    . PERIPHERAL VASCULAR DISEASE 02/17/2007   Qualifier: Diagnosis of  By: [place], [place]    . Regional enteritis of large intestine ([place]) 06/23/2008   Qualifier: Diagnosis of  By: Sharlett Iles MD FACG, [place], HX OF 12/26/2009   Qualifier: Diagnosis of  By: Bullins CMA, [place]    . TONSILLECTOMY, HX OF 02/17/2007   Qualifier: Diagnosis of  By: Danny Lawless CMA, [place]      Patient Active Problem List   Diagnosis Date Noted  . Syncope and collapse 11/15/2017  . Abnormal LFTs 07/02/2017  .  Renal insufficiency 07/02/2017  . Hereditary and idiopathic peripheral neuropathy 04/26/2015  . Right-sided low back pain with right-sided sciatica 04/26/2015  . History of BPH 06/21/2014  . Arthritis of right lower extremity 05/04/2014  . Low serum testosterone level 05/23/2013  . Gynecomastia, male 05/14/2013  . Benign prostate hyperplasia 01/10/2012  . Borderline diabetic 12/20/2011  . Routine health maintenance 06/30/2011  . Increased frequency of urination 03/27/2011  . TOBACCO ABUSE, HX OF 12/26/2009  . REGIONAL ENTERITIS OF LARGE INTESTINE 06/23/2008  . Mixed hyperlipidemia 02/17/2007  . Spinal stenosis of lumbar region 02/17/2007  . OVERACTIVE BLADDER 02/17/2007  . Elevated blood sugar 02/17/2007    Past Surgical History:  Procedure Laterality Date  . cervical hemilaminectomy     C5-6, C6-7  . dupuytrens contracture relief    . Ectropion correction  2010   Left lower eye lid  . LOOP RECORDER INSERTION N/A 01/23/2018   Procedure: LOOP RECORDER INSERTION;  Surgeon: Constance Haw, MD;  Location: [facility];  Service: Cardiovascular;  Laterality: N/A;  . SEPTOPLASTY    . SHOULDER ARTHROSCOPY    . TONSILLECTOMY         Home Medications    Prior to Admission medications   Medication Sig Start Date End Date Taking? Authorizing Provider  doxycycline (VIBRAMYCIN) 100  MG capsule Take 1 capsule (100 mg total) by mouth 2 (two) times daily. 05/08/20  Yes Raspet, Derry Skill, PA-C  acetaminophen (TYLENOL) 500 MG tablet Take 1,000 mg by mouth every 6 (six) hours as needed for moderate pain or headache.     [provider]  finasteride (PROSCAR) 5 MG tablet Take 5 mg by mouth daily.    [provider]  loratadine (CLARITIN) 10 MG tablet Take 10 mg by mouth daily. 02/16/19   [provider]  lovastatin (MEVACOR) 40 MG tablet Take 40 mg by mouth daily.    [provider]  metFORMIN (GLUMETZA) 500 MG (MOD) 24 hr tablet Take 500 mg by mouth  daily with breakfast.    [provider]  montelukast (SINGULAIR) 10 MG tablet Take 10 mg by mouth daily. 09/09/18   [provider]  Oxcarbazepine (TRILEPTAL) 300 MG tablet Take 150-300 mg by mouth See admin instructions. Take 150 mg by mouth in the morning, take 150 mg by mouth in the afternoon and take 300 mg by mouth at bedtime    [provider]  SYMBICORT 160-4.5 MCG/ACT inhaler Inhale 2 puffs into the lungs 2 (two) times daily. 02/01/19   [provider]  tolterodine (DETROL) 2 MG tablet Take 2 mg by mouth 2 (two) times daily.    [provider]    Family History Family History  Problem Relation Age of Onset  . Diabetes Mother   . Heart attack Mother   . Heart disease Mother   . Alzheimer's disease Father   . Cancer Father   . Deep vein thrombosis Brother   . Cancer Brother   . Colon cancer Neg Hx   . Prostate cancer Neg Hx   . Stroke Neg Hx   . Coronary artery disease Neg Hx     Social History Social History   Tobacco Use  . Smoking status: Former Smoker    Quit date: 11/22/1988    Years since quitting: 31.4  . Smokeless tobacco: Never Used  Vaping Use  . Vaping Use: Never used  Substance Use Topics  . Alcohol use: Yes    Alcohol/week: 0.0 standard drinks    Comment: 1 beer/month  . Drug use: No     Allergies   Bee venom and Valdecoxib   Review of Systems Review of Systems  HENT: Negative.   Respiratory: Positive for cough. Negative for shortness of breath and wheezing.   Musculoskeletal: Negative.      Physical Exam Triage Vital Signs ED Triage Vitals  Enc Vitals Group     BP 05/11/20 1634 123/61     Pulse Rate 05/11/20 1634 85     Resp 05/11/20 1634 20     Temp 05/11/20 1634 98 F (36.7 C)     Temp src --      SpO2 05/11/20 1634 96 %     Weight --      Height --      Head Circumference --      Peak Flow --      Pain Score 05/11/20 1635 0     Pain Loc --      Pain Edu? --      Excl. in [place]? --     No data found.  Updated Vital Signs BP 123/61 (BP Location: Right Arm)   Pulse 85   Temp 98 F (36.7 C)   Resp 20   SpO2 96%   Visual Acuity Right Eye Distance:  Left Eye Distance:   Bilateral Distance:    Right Eye Near:   Left Eye Near:    Bilateral Near:     Physical Exam Vitals and nursing note reviewed.  Constitutional:      Appearance: He is ill-appearing.  Cardiovascular:     Rate and Rhythm: Normal rate and regular rhythm.     Heart sounds: Normal heart sounds.  Pulmonary:     Effort: Pulmonary effort is normal.     Breath sounds: Normal breath sounds. No wheezing, rhonchi or rales.  Abdominal:     General: Abdomen is flat. Bowel sounds are normal.  Neurological:     Mental Status: He is alert.      UC Treatments / Results  Labs (all labs ordered are listed, but only abnormal results are displayed) Labs Reviewed - No data to display  EKG   Radiology No results found.  Procedures Procedures (including critical care time)  Medications Ordered in UC Medications - No data to display  Initial Impression / Assessment and Plan / UC Course  I have reviewed the triage vital signs and the nursing notes.  Pertinent labs & imaging results that were available during my care of the patient were reviewed by me and considered in my medical decision making (see chart for details).     1.  COPD exacerbation secondary to COVID-19 infection: Patient was reassured that he is improving.  He is hemodynamically stable with pulse oximetry of 96%.  He was not prescribed any antiviral medication because he was outside the 5-day window.  At this time he is outside the 10-day window for monoclonal antibodies.  He is improving. I encouraged him to continue his medications.  Patient was advised to stop retesting since he is going to test positive for up to 90 days Final Clinical Impressions(s) / UC Diagnoses   Final diagnoses:  COPD exacerbation ([place])  COVID-19 virus  infection     Discharge Instructions     Please continue current medication regimen Return to urgent care if you have worsening shortness of breath.   ED Prescriptions    None     PDMP not reviewed this encounter.   Chase Picket, MD 05/14/20 [id number]    Chase Picket, MD 05/14/20 [phone]

## 2020-05-19 ENCOUNTER — Telehealth: Payer: Self-pay

## 2020-05-19 NOTE — Telephone Encounter (Signed)
Referral on file from [facility], np [phone], sent referral to scheduling

## 2020-05-19 NOTE — Progress Notes (Signed)
Carelink Summary Report / Loop Recorder 

## 2020-06-30 ENCOUNTER — Ambulatory Visit: Payer: Medicare HMO | Admitting: Family

## 2020-06-30 ENCOUNTER — Encounter: Payer: Self-pay | Admitting: Family

## 2020-06-30 ENCOUNTER — Other Ambulatory Visit: Payer: Self-pay

## 2020-06-30 VITALS — BP 100/60 | HR 101 | Ht 69.0 in | Wt 196.0 lb

## 2020-06-30 DIAGNOSIS — R42 Dizziness and giddiness: Secondary | ICD-10-CM

## 2020-06-30 DIAGNOSIS — I951 Orthostatic hypotension: Secondary | ICD-10-CM

## 2020-06-30 MED ORDER — MIDODRINE HCL 2.5 MG PO TABS: 2.5000 mg | ORAL_TABLET | Freq: Two times a day (BID) | ORAL | 2 refills | Status: DC

## 2020-06-30 NOTE — Progress Notes (Signed)
Office Visit    Patient Name: Edward English Date of Encounter: 06/30/2020  PCP:  Medicine, [facility]  Cardiologist:  Jenkins Rouge, MD  Advanced Practice Provider:  No care team member to display Electrophysiologist:  Will Meredith Leeds, MD   Chief Complaint    Edward English is a 87 y.o. male with a hx of HLD, dizziness, BPH, prediabetes, syncope s/p ILR, orthostatic hypotension presents today for lightheadedness and dizziness.   Past Medical History    Past Medical History:  Diagnosis Date   BENIGN PROSTATIC HYPERTROPHY, HX OF 02/17/2007   Qualifier: Diagnosis of  By: [place], [place]     DIVERTICULITIS-COLON 02/04/2008   Qualifier: Diagnosis of  By: Sharlett Iles MD Cline Cools R    ERECTILE DYSFUNCTION, MILD 02/17/2007   Qualifier: Diagnosis of  By: [place], [place]     HYPERGLYCEMIA 02/17/2007   Qualifier: Diagnosis of  By: [place], [place]     HYPERLIPIDEMIA 02/17/2007   Qualifier: Diagnosis of  By: [place], [place]     HYPOGONADISM 02/17/2007   Qualifier: Diagnosis of  By: [place], [place]     OVERACTIVE BLADDER 02/17/2007   Qualifier: Diagnosis of  By: [place], [place]     PERIPHERAL NEUROPATHY 02/17/2007   Qualifier: Diagnosis of  By: [place], [place]     PERIPHERAL VASCULAR DISEASE 02/17/2007   Qualifier: Diagnosis of  By: [place], [place]     Regional enteritis of large intestine ([place]) 06/23/2008   Qualifier: Diagnosis of  By: Sharlett Iles MD Byrd Hesselbach    TOBACCO ABUSE, HX OF 12/26/2009   Qualifier: Diagnosis of  By: Bullins CMA, [place], HX OF 02/17/2007   Qualifier: Diagnosis of  By: [place], [place]     Past Surgical History:  Procedure Laterality Date   cervical hemilaminectomy     C5-6, C6-7   dupuytrens contracture relief     Ectropion correction  2010   Left lower eye lid   LOOP RECORDER INSERTION N/A 01/23/2018   Procedure: LOOP RECORDER INSERTION;   Surgeon: Constance Haw, MD;  Location: [facility];  Service: Cardiovascular;  Laterality: N/A;   SEPTOPLASTY     SHOULDER ARTHROSCOPY     TONSILLECTOMY      Allergies  Allergies  Allergen Reactions   Bee Venom Swelling   Valdecoxib Rash    History of Present Illness    Edward English is a 87 y.o. male with a hx of HLD, dizziness, BPH, prediabetes, syncope s/p ILR, orthostatic hypotension last seen 03/04/19 by Dr. Curt Bears.  Initially admitted November 2019 with orthostatic symptoms and possible syncope.  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  He was given saline bolus in the emergency department with improvement in blood pressure.  He did lose bladder control.  Noted to have previous similar episodes.  Echocardiogram 11/15/2017 LVEF 36%, grade 1 diastolic dysfunction, trivial AI, trivial MR.  Linq monitor was implanted 01/23/2018.  Flomax was previously been discontinued due to dizziness.  He was last seen by Dr. Curt Bears 02/22/2019.  Noted no recurrent syncope and Linq monitoring was continued.  02/07/2020 Linq monitor alerted for short ST versus SVT.  He was contacted by our office and noted no symptoms and was likely asleep at the time as episode occurred at 2:49AM. Episode lasted 7 seconds with median rate 162. Device check 04/2020 with aforementioned episode  but no other arrhythmia.   Seen by primary care office 05/17/20 noting dizziness. He had COVID 10 day sprior, felt poorly for several days, was improving but still with cough and fatigue. He noted dizziness for one year. BP at that time 102/56. Requested to see provider outside of [place] cards/neuro as had previously been evaluated by them. He was referred back to cardiology as well as neurology.   Labs 05/17/20 Creatinine 1.09, GFR 67, Na 145, K 4.7, AST 14, ALT 17 A1c 6.5 Total cholesterol 138, triglycerides 145, HDL 29, LDL 83  Seen by urology 06/15/20 and he was recommended to stop Uroxatral to see if it  improved his dizziness.   He presents today for follow up. Tells me he feels he is back to his baseline after having COVID. He has not had recurrent syncope since ILR impant. Note both lightheadedness and dizziness. This occurs most often with position changes, is outdoors in the heat, or up and moving. Tells me when he goes outside he is "unsure of his steps" and worries he is going to fall. He does additionally have a history of neuropathy for which he sees neurology. Pain is improved since starting Amitryptiline. Previously on gabapentin which he tells me was not effective.. Notes no symptoms of claudication. He had been drinking two 16-oz iced tea half-sweet and half-unsweet and a 12 oz glass of water at dinner time. He eats breakfast and dinner. He is "hit and miss" on lunch.   Notes no shortness of breath at rest. He notes dyspnea only when he does more than usual activity. Notes no chest pain, pressure, tightness. He will have mild swelling in his ankles at the end of the day and notices a line from his socks but this resolves by the morning. Denies palpitations.   EKGs/Labs/Other Studies Reviewed:   The following studies were reviewed today:  Echo 11/2017 - Left ventricle: The cavity size was normal. Wall thickness was    increased in a pattern of mild LVH. Systolic function was normal.    The estimated ejection fraction was 55%. Although no diagnostic    regional wall motion abnormality was identified, this possibility    cannot be completely excluded on the basis of this study. Doppler    parameters are consistent with abnormal left ventricular    relaxation (grade 1 diastolic dysfunction).  - Aortic valve: Mildly calcified annulus. Trileaflet; mildly    calcified leaflets. There was trivial regurgitation.  - Mitral valve: Mildly calcified annulus. There was trivial    regurgitation.  - Right atrium: Central venous pressure (est): 3 mm Hg.  - Atrial septum: No defect or patent  foramen ovale was identified.  - Tricuspid valve: There was mild regurgitation.  - Pulmonary arteries: PA peak pressure: 23 mm Hg (S).  - Pericardium, extracardiac: There was no pericardial effusion.   EKG:  EKG is  ordered today.  The ekg ordered today demonstrates sT 101 bpm no acute ST/T wave changes.   Recent Labs: No results found for requested labs within last 8760 hours.  Recent Lipid Panel    Component Value Date/Time   CHOL 177 11/15/2017 1901   TRIG 141 11/15/2017 1901   HDL 33 (L) 11/15/2017 1901   CHOLHDL 5.4 11/15/2017 1901   VLDL 28 11/15/2017 1901   LDLCALC 116 (H) 11/15/2017 1901   LDLDIRECT 97.2 11/30/2013 1336    Home Medications   Current Meds  Medication Sig   acetaminophen (TYLENOL) 500 MG tablet Take 1,000  mg by mouth every 6 (six) hours as needed for moderate pain or headache.    amitriptyline (ELAVIL) 10 MG tablet Take 10 mg by mouth 3 (three) times daily.   loratadine (CLARITIN) 10 MG tablet Take 10 mg by mouth daily.   lovastatin (MEVACOR) 40 MG tablet Take 40 mg by mouth daily.   metFORMIN (GLUMETZA) 500 MG (MOD) 24 hr tablet Take 500 mg by mouth daily with breakfast.   Oxcarbazepine (TRILEPTAL) 300 MG tablet Take 150-300 mg by mouth See admin instructions. Take 150 mg by mouth in the morning, take 150 mg by mouth in the afternoon and take 300 mg by mouth at bedtime   tolterodine (DETROL) 2 MG tablet Take 2 mg by mouth 2 (two) times daily.     Review of Systems    Review of Systems  Constitutional: Negative for chills, fever and malaise/fatigue.  Cardiovascular:  Positive for leg swelling. Negative for chest pain, dyspnea on exertion, irregular heartbeat, near-syncope, orthopnea, palpitations and syncope.  Respiratory:  Negative for cough, shortness of breath and wheezing.   Gastrointestinal:  Negative for melena, nausea and vomiting.  Genitourinary:  Negative for hematuria.  Neurological:  Positive for dizziness and light-headedness. Negative for  weakness.  All other systems reviewed and are otherwise negative except as noted above.  Physical Exam    VS:  BP 100/60 (BP Location: Left Arm, Patient Position: Sitting, Cuff Size: Normal)   Pulse (!) 101   Ht 5' 9"  (1.753 m)   Wt 196 lb (88.9 kg)   SpO2 96%   BMI 28.94 kg/m  , BMI Body mass index is 28.94 kg/m.  Wt Readings from Last 3 Encounters:  06/30/20 196 lb (88.9 kg)  02/22/19 197 lb (89.4 kg)  01/23/18 190 lb (86.2 kg)    GEN: Well nourished, well developed, in no acute distress. HEENT: normal. Neck: Supple, no JVD, carotid bruits, or masses. Cardiac: RRR, no murmurs, rubs, or gallops. No clubbing, cyanosis. Trace bilateral pedal edema.  Radials/PT 2+ and equal bilaterally.  Respiratory:  Respirations regular and unlabored, clear to auscultation bilaterally. GI: Soft, nontender, nondistended. MS: No deformity or atrophy. Skin: Warm and dry, no rash. Neuro:  Strength and sensation are intact. Psych: Normal affect.  Assessment & Plan    Lightheadedness / Syncope / s/p ILR / Orthostatic hypotension -SVT 01/2020 which occurred at 2 AM and patient was asleep at time.  Likely asymptomatic.  No other acute arrhythmias noted.  Symptoms of lightheadedness and dizziness are consistent with orthostatic hypotension.  Notes blood pressure routinely runs low at home. Low today in clinic 100/60. Discussed precautions including staying well-hydrated, making position changes slowly, wearing compression stockings.  Of note he drinks 2 glasses of caffeinated tea per day and 1 glass of water which is not adequate hydration.  Plan for echocardiogram to rule out valvular abnormality as contributory. Discussed preventative measures only versus addition of medication he prefers addition of medication as symptoms have been longstanding and very bothersome.  Start midodrine 2.5 mg twice daily for orthostatic hypotension.  Dose can be further uptitrated as needed based on response. He is agreeable to  monitor BP at home.   HLD - Continue Lovasatin per primary care provider.   BPH - In setting of orthostasis, avoid flomax or vasodilators.   Hx of COVID 19 - Feels symptoms have resolved. Plan for echocardiogram to rule out any long term effects from COVID infection.   Syncope - No recurrence since prior to 01/2018.  Disposition: Follow up in 6 week(s) with Dr. Johnsie Cancel or APP and in 6 months with Dr. Curt Bears or APP.  Signed, Loel Dubonnet, NP 06/30/2020, 10:12 AM [place]

## 2020-06-30 NOTE — Patient Instructions (Addendum)
Medication Instructions:  Your physician has recommended you make the following change in your medication:   START Midodrine 2.22m (one tablet) twice daily with meals  *If you need a refill on your cardiac medications before your next appointment, please call your pharmacy*   Lab Work: None ordered today  Testing/Procedures: Your EKG today shows since tachycardia which is a slightly fast but normal heart rhythm.   Your physician has requested that you have an echocardiogram. Echocardiography is a painless test that uses sound waves to create images of your heart. It provides your doctor with information about the size and shape of your heart and how well your heart's chambers and valves are working. This procedure takes approximately one hour. There are no restrictions for this procedure.  Follow-Up: [facility] and your [facility] needs are our priority.  As part of our continuing mission to provide you with exceptional heart care, we have created designated Provider Care Teams.  These Care Teams include your primary Cardiologist (physician) and Advanced Practice Providers (APPs -  Physician Assistants and Nurse Practitioners) who all work together to provide you with the care you need, when you need it.  We recommend signing up for the patient portal called "MyChart".  Sign up information is provided on this After Visit Summary.  MyChart is used to connect with patients for Virtual Visits (Telemedicine).  Patients are able to view lab/test results, encounter notes, upcoming appointments, etc.  Non-urgent messages can be sent to your provider as well.   To learn more about what you can do with MyChart, go to [email or web address]    Your next appointment:   6 weeks  The format for your next appointment:   In Person  Provider:   You may see PJenkins Rouge MD or one of the following Advanced Practice Providers on your designated Care Team:   JKathyrn Drown NP   Other  Instructions  INCREASE how much fluid you drink Recommend wearing compression socks during the day CHANGE your tea to de-caffeinated. This will help prevent you from getting dehydrated.  Contact our office if your blood pressure is consistently more than 140 for the top number. Recommend checking 3 times per week.   Orthostatic Hypotension Blood pressure is a measurement of how strongly, or weakly, your blood is pressing against the walls of your arteries. Orthostatic hypotension is a sudden drop in blood pressure that happens when you quickly change positions,such as when you get up from sitting or lying down. Arteries are blood vessels that carry blood from your heart throughout your body. When blood pressure is too low, you may not get enough blood to your brain or to the rest of your organs. This can cause weakness, light-headedness, rapid heartbeat, and fainting. This can last for just a few seconds or for up to a few minutes. Orthostatic hypotension is usually not a serious problem. However, if it happens frequently or gets worse, it may be a sign of somethingmore serious. What are the causes? This condition may be caused by: Sudden changes in posture, such as standing up quickly after you have been sitting or lying down. Blood loss. Loss of body fluids (dehydration). Heart problems. Hormone (endocrine) problems. Pregnancy. Severe infection. Lack of certain nutrients. Severe allergic reactions (anaphylaxis). Certain medicines, such as blood pressure medicine or medicines that make the body lose excess fluids (diuretics). Sometimes, this condition can be caused by not taking medicine as directed, such as taking too much of a certain medicine.  What increases the risk? The following factors may make you more likely to develop this condition: Age. Risk increases as you get older. Conditions that affect the heart or the central nervous system. Taking certain medicines, such as blood  pressure medicine or diuretics. Being pregnant. What are the signs or symptoms? Symptoms of this condition may include: Weakness. Light-headedness. Dizziness. Blurred vision. Fatigue. Rapid heartbeat. Fainting, in severe cases. How is this diagnosed? This condition is diagnosed based on: Your medical history. Your symptoms. Your blood pressure measurement. Your health care provider will check your blood pressure when you are: Lying down. Sitting. Standing. A blood pressure reading is recorded as two numbers, such as "120 over 80" (or 120/80). The first ("top") number is called the systolic pressure. It is a measure of the pressure in your arteries as your heart beats. The second ("bottom") number is called the diastolic pressure. It is a measure of the pressure in your arteries when your heart relaxes between beats. Blood pressure is measured in a unit called mm Hg. Healthy blood pressure for most adults is 120/80. If your blood pressure is below 90/60, you may be diagnosed withhypotension. Other information or tests that may be used to diagnose orthostatic hypotension include: Your other vital signs, such as your heart rate and temperature. Blood tests. Tilt table test. For this test, you will be safely secured to a table that moves you from a lying position to an upright position. Your heart rhythm and blood pressure will be monitored during the test. How is this treated? This condition may be treated by: Changing your diet. This may involve eating more salt (sodium) or drinking more water. Taking medicines to raise your blood pressure. Changing the dosage of certain medicines you are taking that might be lowering your blood pressure. Wearing compression stockings. These stockings help to prevent blood clots and reduce swelling in your legs. In some cases, you may need to go to the hospital for: Fluid replacement. This means you will receive fluids through an IV. Blood replacement.  This means you will receive donated blood through an IV (transfusion). Treating an infection or heart problems, if this applies. Monitoring. You may need to be monitored while medicines that you are taking wear off. Follow these instructions at home: Eating and drinking  Drink enough fluid to keep your urine pale yellow. Eat a healthy diet, and follow instructions from your health care provider about eating or drinking restrictions. A healthy diet includes: Fresh fruits and vegetables. Whole grains. Lean meats. Low-fat dairy products. Eat extra salt only as directed. Do not add extra salt to your diet unless your health care provider told you to do that. Eat frequent, small meals. Avoid standing up suddenly after eating.  Medicines Take over-the-counter and prescription medicines only as told by your health care provider. Follow instructions from your health care provider about changing the dosage of your current medicines, if this applies. Do not stop or adjust any of your medicines on your own. General instructions  Wear compression stockings as told by your health care provider. Get up slowly from lying down or sitting positions. This gives your blood pressure a chance to adjust. Avoid hot showers and excessive heat as directed by your health care provider. Return to your normal activities as told by your health care provider. Ask your health care provider what activities are safe for you. Do not use any products that contain nicotine or tobacco, such as cigarettes, e-cigarettes, and chewing tobacco. If  you need help quitting, ask your health care provider. Keep all follow-up visits as told by your health care provider. This is important.  Contact a health care provider if you: Vomit. Have diarrhea. Have a fever for more than 2-3 days. Feel more thirsty than usual. Feel weak and tired. Get help right away if you: Have chest pain. Have a fast or irregular heartbeat. Develop  numbness in any part of your body. Cannot move your arms or your legs. Have trouble speaking. Become sweaty or feel light-headed. Faint. Feel short of breath. Have trouble staying awake. Feel confused. Summary Orthostatic hypotension is a sudden drop in blood pressure that happens when you quickly change positions. Orthostatic hypotension is usually not a serious problem. It is diagnosed by having your blood pressure taken lying down, sitting, and then standing. It may be treated by changing your diet or adjusting your medicines. This information is not intended to replace advice given to you by your health care provider. Make sure you discuss any questions you have with your healthcare provider. Document Revised: 06/19/2017 Document Reviewed: 06/19/2017 Elsevier Patient Education  [place].

## 2020-07-13 IMAGING — CR DG CHEST 2V
2 series · 2 of 2 positions shown · non-contrast
Comparison: 06/05/2017

CLINICAL DATA: Fell 1 week ago, RIGHT anterior rib pain, cough

EXAM:
CHEST - 2 VIEW

[w chest pa]
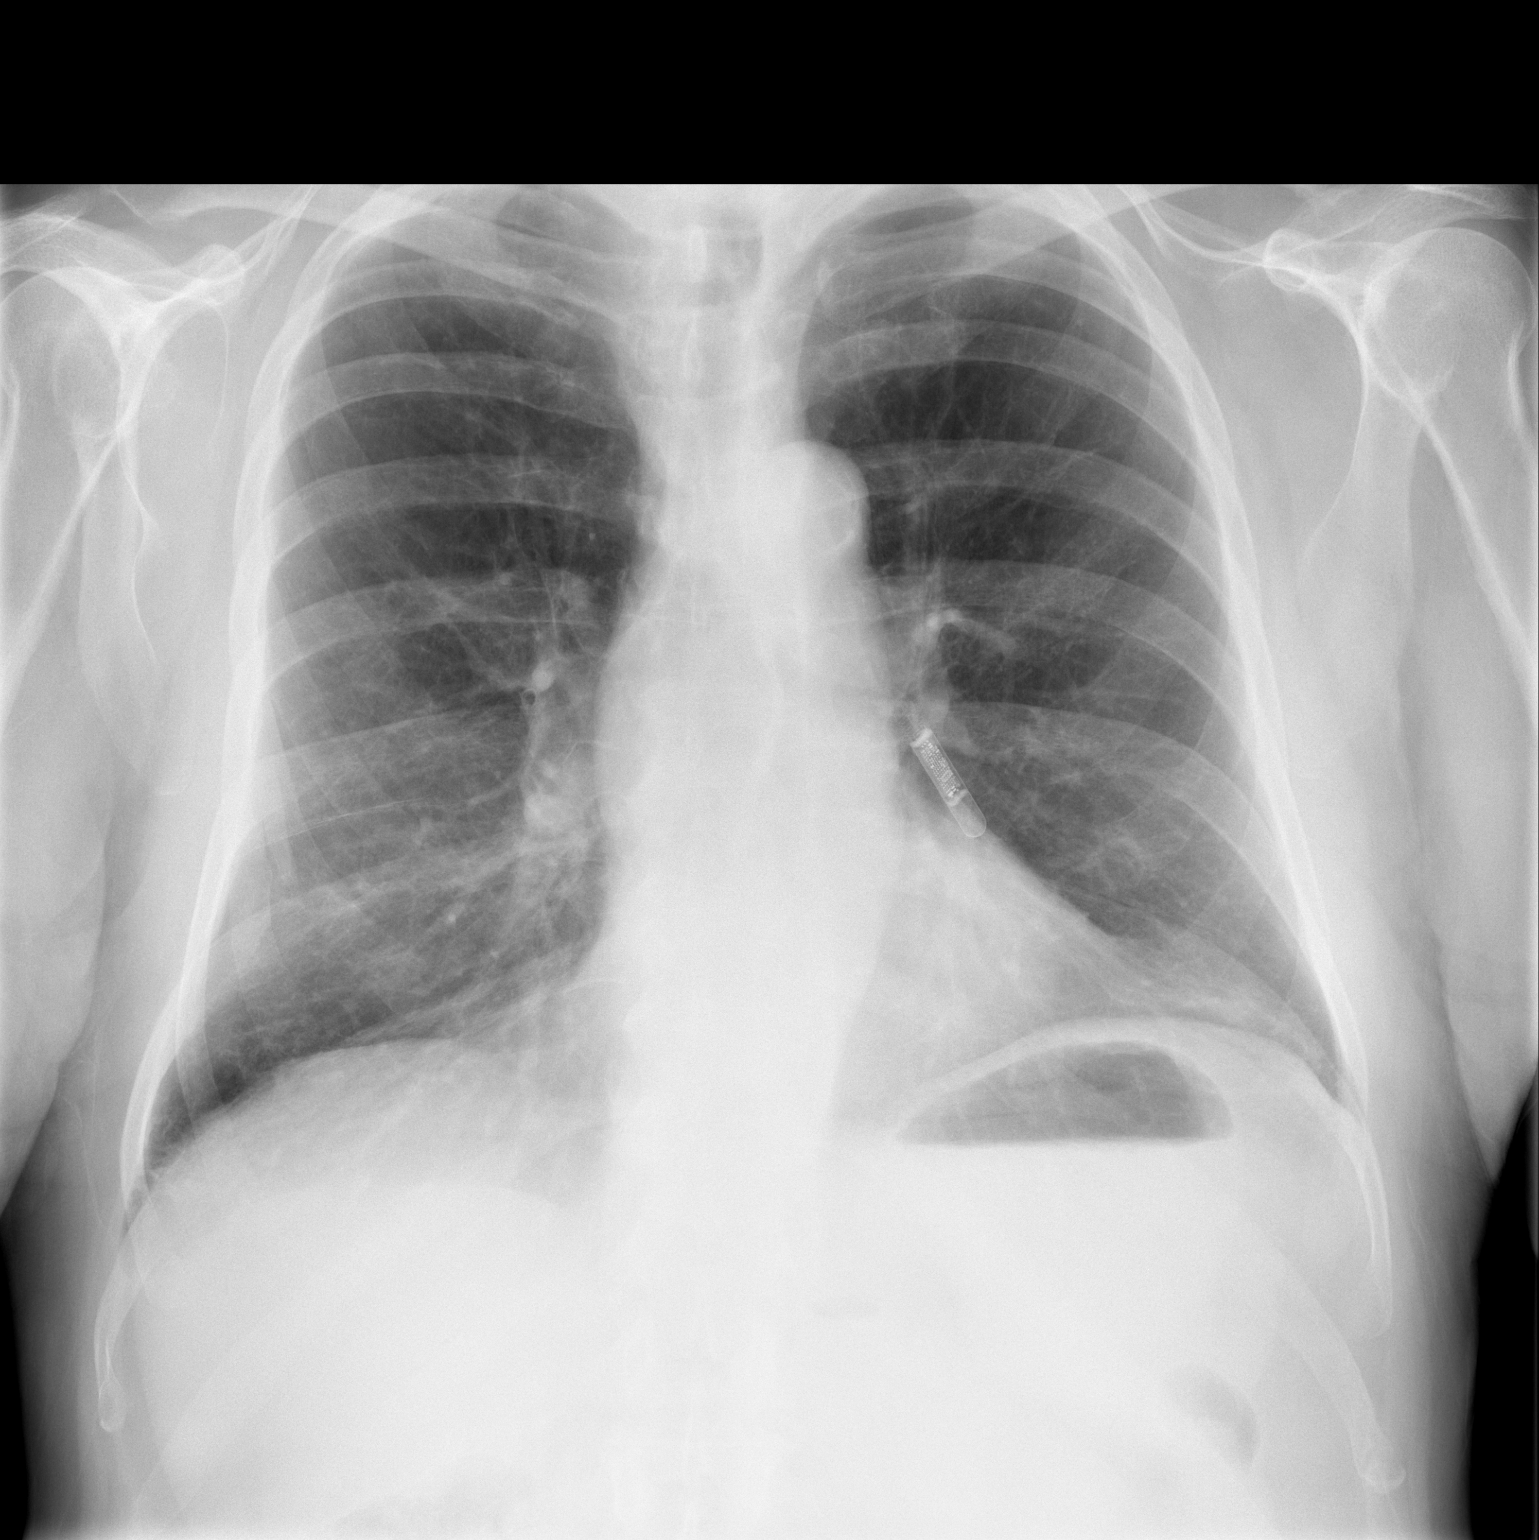

[w chest lat]
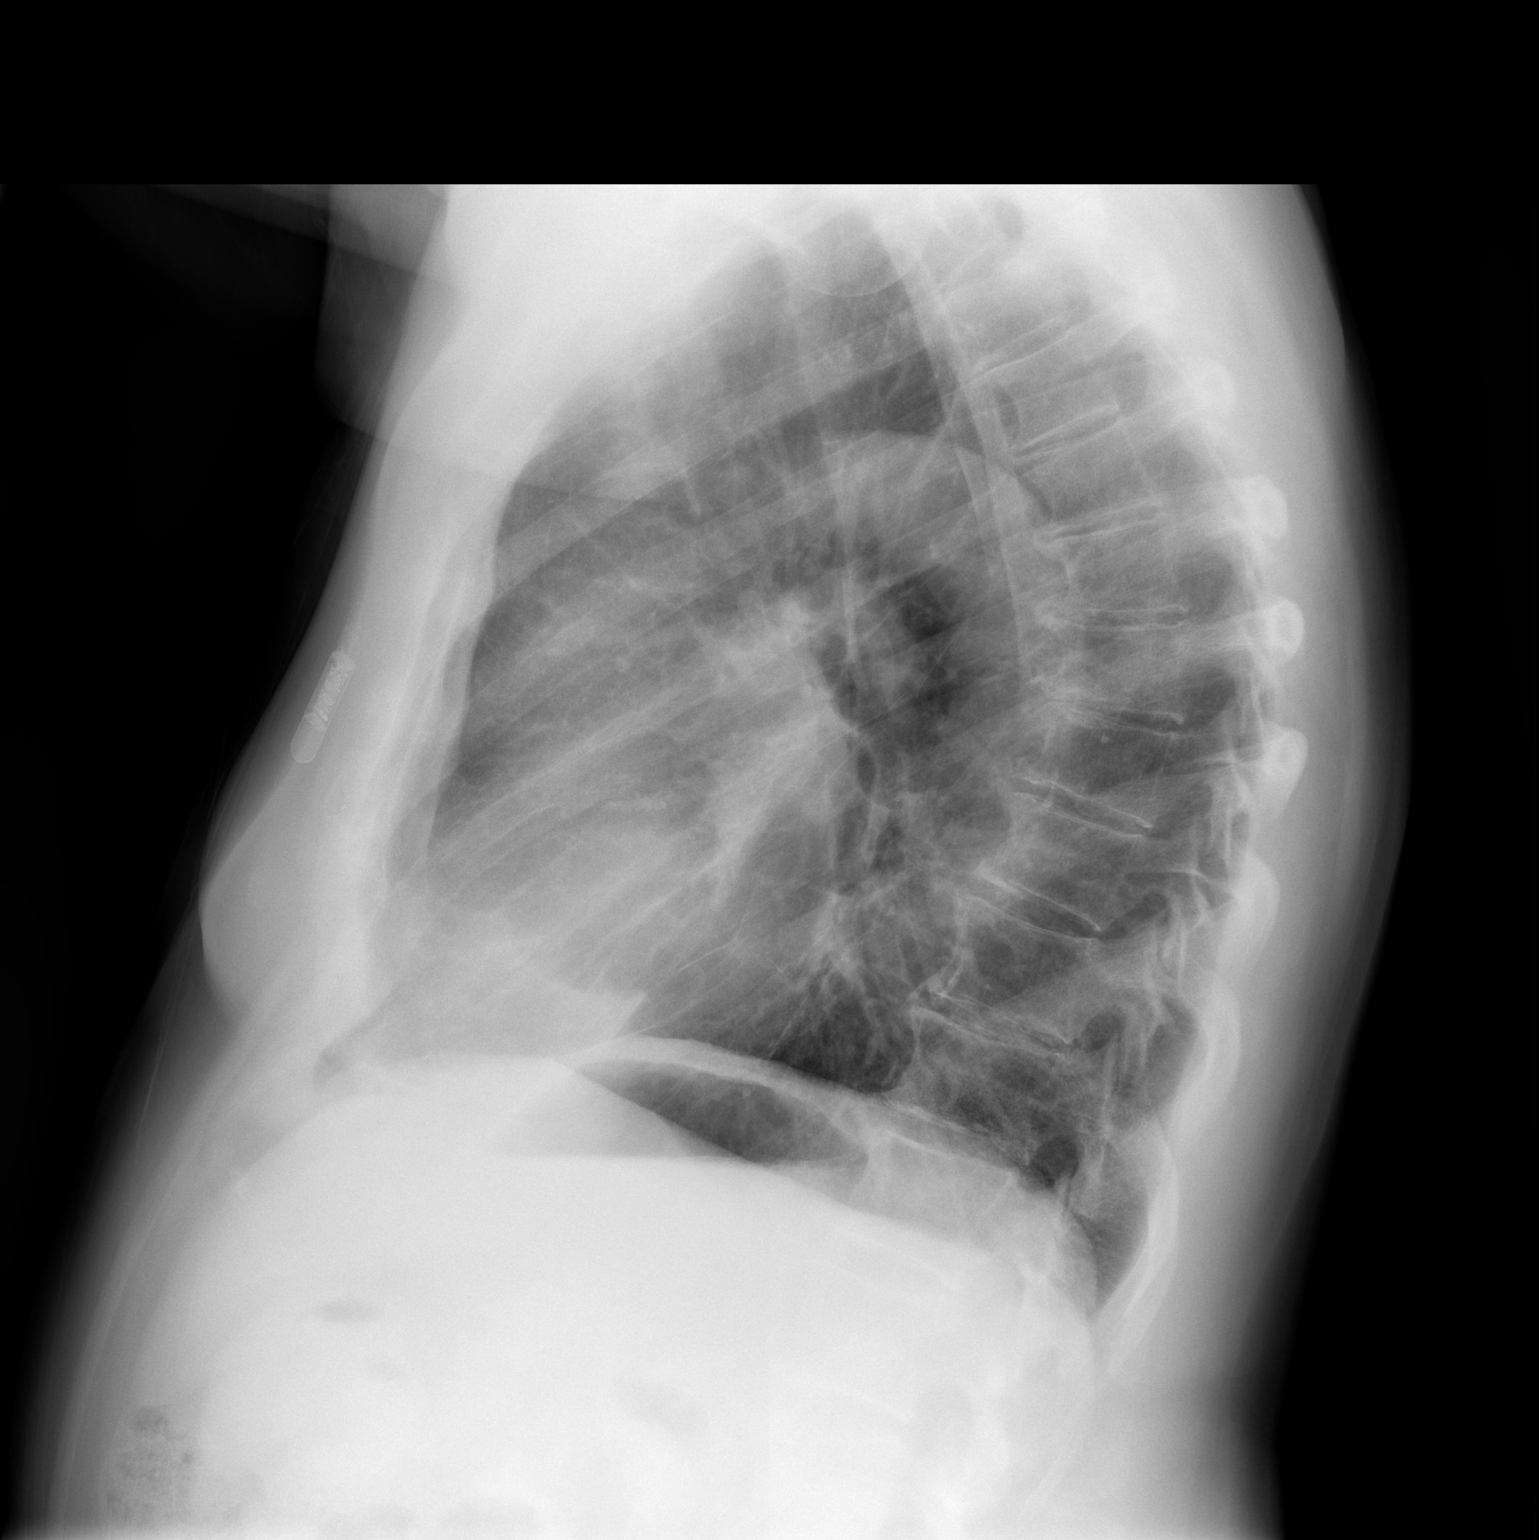

[2 of 2 positions shown; findings below may reference images not displayed]

FINDINGS: Loop recorder projects over LEFT chest.

Normal heart size, mediastinal contours, and pulmonary vascularity.

Atherosclerotic calcification aorta.

Peribronchial thickening and mild hyperinflation question COPD.

No acute infiltrate, pleural effusion or pneumothorax.

Slight chronic deformity of the anterior RIGHT sixth and seventh
ribs is unchanged question developmental.
IMPRESSION: Question COPD.

No acute abnormalities.

## 2020-07-24 ENCOUNTER — Other Ambulatory Visit: Payer: Self-pay

## 2020-07-24 ENCOUNTER — Ambulatory Visit (HOSPITAL_COMMUNITY): Payer: Medicare HMO | Attending: Internal Medicine

## 2020-07-24 DIAGNOSIS — R42 Dizziness and giddiness: Secondary | ICD-10-CM

## 2020-07-24 DIAGNOSIS — I951 Orthostatic hypotension: Secondary | ICD-10-CM | POA: Diagnosis not present

## 2020-07-24 LAB — ECHOCARDIOGRAM COMPLETE
Area-P 1/2: 8.82 cm2
P 1/2 time: 1254 msec
S' Lateral: 3.7 cm

## 2020-07-25 ENCOUNTER — Other Ambulatory Visit (HOSPITAL_COMMUNITY): Payer: Medicare HMO

## 2020-07-31 ENCOUNTER — Ambulatory Visit (INDEPENDENT_AMBULATORY_CARE_PROVIDER_SITE_OTHER): Payer: Medicare HMO

## 2020-07-31 DIAGNOSIS — R55 Syncope and collapse: Secondary | ICD-10-CM | POA: Diagnosis not present

## 2020-08-02 LAB — CUP PACEART REMOTE DEVICE CHECK
Date Time Interrogation Session: 20220725002043
Implantable Pulse Generator Implant Date: 20200117

## 2020-08-10 ENCOUNTER — Telehealth: Payer: Self-pay | Admitting: Physician Assistant

## 2020-08-10 DIAGNOSIS — I951 Orthostatic hypotension: Secondary | ICD-10-CM

## 2020-08-10 DIAGNOSIS — R42 Dizziness and giddiness: Secondary | ICD-10-CM

## 2020-08-10 MED ORDER — MIDODRINE HCL 2.5 MG PO TABS: 2.5000 mg | ORAL_TABLET | Freq: Three times a day (TID) | ORAL | 2 refills | Status: DC

## 2020-08-10 NOTE — Telephone Encounter (Signed)
Called patient back with Caitlin's recommendations. Patient stated the last time he took BP was on 07/26/20 (93/58, 114/54 and HR 80's). Patient will try taking midodrine 2.5 mg TID and check his BP. Patient will report on Monday and let us know if we need to send in new prescription. Updated patient's medication list, but did not send anything to pharmacy until patient calls on Monday.

## 2020-08-10 NOTE — Telephone Encounter (Signed)
Does he have any recent blood pressure readings? If he is not noting improvement may need increased dose. Recommend increasing to 2.2m TID with meals. Recommend he call [place] with a report of BP readings and we can further increase dose if needed.   [facility] is $29.56 with Good Rx coupon or [facility] $22.10 with Good Rx coupon.   CLoel Dubonnet NP

## 2020-08-10 NOTE — Telephone Encounter (Signed)
Pt is calling with questions about his medications

## 2020-08-10 NOTE — Telephone Encounter (Signed)
Patient called about his midodrine 2.5 mg BID. Patient stated he has been taking this medications for over a month and he does not see any changes in his symptoms. Patient stated he does not want to pick up another prescription if it's not helping due to the cost. Patient stated he is still having dizziness when standing and moving around. Informed patient that a message would be sent to Laurann Montana NP for advisement since she started patient on medication.

## 2020-08-25 NOTE — Progress Notes (Signed)
Carelink Summary Report / Loop Recorder 

## 2020-08-29 NOTE — Progress Notes (Signed)
Cardiology Office Note:    Date:  08/30/2020   ID:  Edward English, DOB [birth year], MRN [id number]  PCP:  Tomasa Hose, NP   [facility] Providers Cardiologist:  Jenkins Rouge, MD Electrophysiologist:  Constance Haw, MD     Referring MD: Medicine, [place]*   Chief Complaint:  Follow-up (Orthostatic hypotension)    Patient Profile:    Edward English is a 87 y.o. male with:  Syncope S/p ILR in 2020 NSVT noted on ILR in 04/2020 Diabetes mellitus  Hyperlipidemia  BPH  Lumbar stenosis Orthostatic hypotension Cymbalta and Flomax DC'd in past Diverticulosis Erectile dysfunction Hx of COVID-19 in 05/2020 Peripheral neuropathy   Prior CV studies: Echocardiogram 07/24/20 EF 50-55, no RWMA, GR 1 DD, RVSP 19, normal RVSF, AV sclerosis without stenosis   History of Present Illness: Mr. Edward English was last seen by Laurann Montana, NP in 6/22 for the evaluation of dizziness in the setting of orthostatic hypotension.  His urologist stopped his Uroxatral prior to this appt. Recommendation was adequate hydration and the addition of midodrine.  The dose has been increased to three times a day since due to low BPs.  A f/u echocardiogram showed low normal EF.  He returns for f/u.  He is here alone.  He did not notice much benefit with taking Midodrine and stopped this on his own.  He has a lot of leg symptoms related to neuropathy.  He does note dizziness with standing.  However, he has more symptoms the longer he remains standing.  He has not had syncope, chest pain, shortness of breath.  He does not wear compression stockings.        Past Medical History:  Diagnosis Date   BENIGN PROSTATIC HYPERTROPHY, HX OF 02/17/2007   Qualifier: Diagnosis of  By: [place], [place]     DIVERTICULITIS-COLON 02/04/2008   Qualifier: Diagnosis of  By: Sharlett Iles MD Cline Cools R    ERECTILE DYSFUNCTION, MILD 02/17/2007   Qualifier: Diagnosis of  By: [place], [place]     HYPERGLYCEMIA  02/17/2007   Qualifier: Diagnosis of  By: [place], [place]     HYPERLIPIDEMIA 02/17/2007   Qualifier: Diagnosis of  By: [place], [place]     HYPOGONADISM 02/17/2007   Qualifier: Diagnosis of  By: [place], [place]     OVERACTIVE BLADDER 02/17/2007   Qualifier: Diagnosis of  By: [place], [place]     PERIPHERAL NEUROPATHY 02/17/2007   Qualifier: Diagnosis of  By: [place], [place]     PERIPHERAL VASCULAR DISEASE 02/17/2007   Qualifier: Diagnosis of  By: [place], [place]     Regional enteritis of large intestine ([place]) 06/23/2008   Qualifier: Diagnosis of  By: Sharlett Iles MD Byrd Hesselbach    TOBACCO ABUSE, HX OF 12/26/2009   Qualifier: Diagnosis of  By: [place] CMA, [place], HX OF 02/17/2007   Qualifier: Diagnosis of  By: Danny Lawless CMA, [place]      Current Medications: Current Meds  Medication Sig   acetaminophen (TYLENOL) 500 MG tablet Take 1,000 mg by mouth every 6 (six) hours as needed for moderate pain or headache.    amitriptyline (ELAVIL) 10 MG tablet Take 10 mg by mouth 3 (three) times daily.   gabapentin (NEURONTIN) 100 MG capsule Take 100 mg by mouth 2 (two) times daily.   loratadine (CLARITIN) 10 MG tablet Take 10 mg by mouth daily.   lovastatin (MEVACOR) 40 MG tablet Take 40 mg by  mouth daily.   metFORMIN (GLUMETZA) 500 MG (MOD) 24 hr tablet Take 500 mg by mouth daily with breakfast.   tolterodine (DETROL) 2 MG tablet Take 2 mg by mouth 2 (two) times daily.     Allergies:   Bee venom and Valdecoxib   Social History   Tobacco Use   Smoking status: Former    Types: Cigarettes    Quit date: 11/22/1988    Years since quitting: 31.7   Smokeless tobacco: Never  Vaping Use   Vaping Use: Never used  Substance Use Topics   Alcohol use: Yes    Alcohol/week: 0.0 standard drinks    Comment: 1 beer/month   Drug use: No     Family Hx: The patient's family history includes Alzheimer's disease in his father; Cancer in his brother and father; Deep vein  thrombosis in his brother; Diabetes in his mother; Heart attack in his mother; Heart disease in his mother. There is no history of Colon cancer, Prostate cancer, Stroke, or Coronary artery disease.  ROS see HPI  EKGs/Labs/Other Test Reviewed:    EKG:  EKG is not ordered today.  The ekg ordered today demonstrates N/A  Recent Labs: No results found for requested labs within last 8760 hours.   Recent Lipid Panel Lab Results  Component Value Date/Time   CHOL 177 11/15/2017 07:01 PM   TRIG 141 11/15/2017 07:01 PM   HDL 33 (L) 11/15/2017 07:01 PM   LDLCALC 116 (H) 11/15/2017 07:01 PM   LDLDIRECT 97.2 11/30/2013 01:36 PM      Risk Assessment/Calculations:      Physical Exam:    VS:  Ht 5' 9"  (1.753 m)   Wt 195 lb (88.5 kg)   SpO2 94%   BMI 28.80 kg/m    Orthostatic VS for the past 24 hrs (Last 3 readings):  BP- Lying Pulse- Lying BP- Sitting Pulse- Sitting BP- Standing at 0 minutes Pulse- Standing at 0 minutes BP- Standing at 3 minutes Pulse- Standing at 3 minutes  08/30/20 1145 105/68 89 107/69 92 103/65 101 108/70 95     Wt Readings from Last 3 Encounters:  08/30/20 195 lb (88.5 kg)  06/30/20 196 lb (88.9 kg)  02/22/19 197 lb (89.4 kg)     Constitutional:      Appearance: Healthy appearance. Not in distress.  Neck:     Vascular: JVD normal.  Pulmonary:     Effort: Pulmonary effort is normal.     Breath sounds: No wheezing. No rales.  Cardiovascular:     Normal rate. Regular rhythm. Normal S1. Normal S2.      Murmurs: There is no murmur.  Edema:    Peripheral edema absent.  Abdominal:     Palpations: Abdomen is soft.  Skin:    General: Skin is warm and dry.  Neurological:     General: No focal deficit present.     Mental Status: Alert and oriented to person, place and time.     Cranial Nerves: Cranial nerves are intact.        ASSESSMENT & PLAN:    1. Orthostatic hypotension 2. Syncope and collapse Orthostatic vital signs in the office today did not  demonstrate significant blood pressure drop.  He had minimal increase in heart rate.  He was symptomatic with this.  He did not have much benefit with Midodrine.  He has a lot of issues with his peripheral neuropathy.  He describes a sensation of not feeling the ground completely as well as  difficulty sensing where his legs are moving.  I suspect a lot of his symptoms are related to peripheral neuropathy.  However, he does have some symptoms of orthostasis.  Given his age and minimal blood pressure changes with orthostatic vital signs today, I think it would be best to try to avoid medications such as fludrocortisone or pyridostigmine.  I have counseled him on proper hygiene for orthostatic hypotension.  We discussed pushing fluids as well as possibly adding a minimal amount of salt to his diet as well as raising the head of his bed and getting up slowly/pumping his calf muscles prior to standing.  I also recommended that he wear compression stockings from morning to night.  He has already been provided these through the [place].  I have asked him to contact me in the next few weeks if his symptoms do not improve with this.  In that case, we could try a low-dose of fludrocortisone to see if he has improved symptoms.  If he is placed on fludrocortisone, he will need to have follow-up electrolytes as well as follow-up office visit.       Dispo:  Return in about 6 months (around 03/02/2021) for Routine follow up in 6 months with Dr. Johnsie Cancel..   Medication Adjustments/Labs and Tests Ordered: Current medicines are reviewed at length with the patient today.  Concerns regarding medicines are outlined above.  Tests Ordered: No orders of the defined types were placed in this encounter.  Medication Changes: No orders of the defined types were placed in this encounter.   Signed, Richardson Dopp, PA-C  08/30/2020 5:12 PM    [facility] [address], [place], [place]  [id number] Phone: [phone]; Fax: [phone]

## 2020-08-30 ENCOUNTER — Encounter: Payer: Self-pay | Admitting: Physician Assistant

## 2020-08-30 ENCOUNTER — Ambulatory Visit: Payer: Medicare HMO | Admitting: Physician Assistant

## 2020-08-30 ENCOUNTER — Other Ambulatory Visit: Payer: Self-pay

## 2020-08-30 VITALS — Ht 69.0 in | Wt 195.0 lb

## 2020-08-30 DIAGNOSIS — I951 Orthostatic hypotension: Secondary | ICD-10-CM | POA: Diagnosis not present

## 2020-08-30 DIAGNOSIS — R55 Syncope and collapse: Secondary | ICD-10-CM | POA: Diagnosis not present

## 2020-08-30 NOTE — Patient Instructions (Signed)
Medication Instructions:  Your physician recommends that you continue on your current medications as directed. Please refer to the Current Medication list given to you today.  *If you need a refill on your cardiac medications before your next appointment, please call your pharmacy*   Lab Work: -None  If you have labs (blood work) drawn today and your tests are completely normal, you will receive your results only by: [place] (if you have MyChart) OR A paper copy in the mail If you have any lab test that is abnormal or we need to change your treatment, we will call you to review the results.   Testing/Procedures: -None  Follow-Up: At [facility], you and your health needs are our priority.  As part of our continuing mission to provide you with exceptional heart care, we have created designated Provider Care Teams.  These Care Teams include your primary Cardiologist (physician) and Advanced Practice Providers (APPs -  Physician Assistants and Nurse Practitioners) who all work together to provide you with the care you need, when you need it.  We recommend signing up for the patient portal called "MyChart".  Sign up information is provided on this After Visit Summary.  MyChart is used to connect with patients for Virtual Visits (Telemedicine).  Patients are able to view lab/test results, encounter notes, upcoming appointments, etc.  Non-urgent messages can be sent to your provider as well.   To learn more about what you can do with MyChart, go to [email or web address].    Your next appointment:   6 month(s)  The format for your next appointment:   In Person  Provider:   Jenkins Rouge, MD   Other Instructions   Push Fluids Add some extra salt to your diet Get up slowly Pump calves before standing Wear knee high compression stockings every day.  After 2 weeks send a message on mychart please with how you are doing.

## 2020-10-30 ENCOUNTER — Ambulatory Visit (INDEPENDENT_AMBULATORY_CARE_PROVIDER_SITE_OTHER): Payer: Medicare HMO

## 2020-10-30 DIAGNOSIS — R55 Syncope and collapse: Secondary | ICD-10-CM | POA: Diagnosis not present

## 2020-10-31 LAB — CUP PACEART REMOTE DEVICE CHECK
Date Time Interrogation Session: 20221024003750
Implantable Pulse Generator Implant Date: 20200117

## 2020-11-07 NOTE — Progress Notes (Signed)
Carelink Summary Report / Loop Recorder 

## 2020-12-26 ENCOUNTER — Encounter: Payer: Medicare HMO | Admitting: Cardiology

## 2020-12-26 NOTE — Progress Notes (Deleted)
Electrophysiology Office Note   Date:  12/26/2020   ID:  Edward English, DOB 04/09/1933, MRN [id number]  PCP:  Tomasa Hose, NP  Cardiologist:  Johnsie Cancel Primary Electrophysiologist:  Constance Haw, MD    No chief complaint on file.    History of Present Illness: Edward English is a 87 y.o. male who is being seen today for the evaluation of syncope at the request of Jenkins Rouge. Presenting today for electrophysiology evaluation.    He was admitted to the hospital 11/15/2017 with orthostatic symptoms and possible syncope.  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  He had no trauma.  He was given a saline bolus in the emergency room which increase his blood pressure to 114/72.  He did lose bladder control.  He had similar symptoms in the past.  Cardiac work-up was negative with no arrhythmia or structural problems.  His Cymbalta and Flomax were stopped which improved his dizziness.  Prior episodes of syncope are associated with weakness and fatigue as well as diaphoresis.  They are not associated with changing positions.  Today, denies symptoms of palpitations, chest pain, shortness of breath, orthopnea, PND, lower extremity edema, claudication, dizziness, presyncope, syncope, bleeding, or neurologic sequela. The patient is tolerating medications without difficulties. ***    Past Medical History:  Diagnosis Date   BENIGN PROSTATIC HYPERTROPHY, HX OF 02/17/2007   Qualifier: Diagnosis of  By: [place], [place]     DIVERTICULITIS-COLON 02/04/2008   Qualifier: Diagnosis of  By: Sharlett Iles MD Cline Cools R    ERECTILE DYSFUNCTION, MILD 02/17/2007   Qualifier: Diagnosis of  By: [place], [place]     HYPERGLYCEMIA 02/17/2007   Qualifier: Diagnosis of  By: [place], [place]     HYPERLIPIDEMIA 02/17/2007   Qualifier: Diagnosis of  By: [place], [place]     HYPOGONADISM 02/17/2007   Qualifier: Diagnosis of  By: [place], [place]     OVERACTIVE BLADDER  02/17/2007   Qualifier: Diagnosis of  By: [place], [place]     PERIPHERAL NEUROPATHY 02/17/2007   Qualifier: Diagnosis of  By: [place], [place]     PERIPHERAL VASCULAR DISEASE 02/17/2007   Qualifier: Diagnosis of  By: [place], [place]     Regional enteritis of large intestine ([place]) 06/23/2008   Qualifier: Diagnosis of  By: Sharlett Iles MD Byrd Hesselbach    TOBACCO ABUSE, HX OF 12/26/2009   Qualifier: Diagnosis of  By: Bullins CMA, [place], HX OF 02/17/2007   Qualifier: Diagnosis of  By: [place], [place]     Past Surgical History:  Procedure Laterality Date   cervical hemilaminectomy     C5-6, C6-7   dupuytrens contracture relief     Ectropion correction  2010   Left lower eye lid   LOOP RECORDER INSERTION N/A 01/23/2018   Procedure: LOOP RECORDER INSERTION;  Surgeon: Constance Haw, MD;  Location: [facility];  Service: Cardiovascular;  Laterality: N/A;   SEPTOPLASTY     SHOULDER ARTHROSCOPY     TONSILLECTOMY       Current Outpatient Medications  Medication Sig Dispense Refill   acetaminophen (TYLENOL) 500 MG tablet Take 1,000 mg by mouth every 6 (six) hours as needed for moderate pain or headache.      amitriptyline (ELAVIL) 10 MG tablet Take 10 mg by mouth 3 (three) times daily.     gabapentin (NEURONTIN) 100 MG capsule Take 100 mg by  mouth 2 (two) times daily.     loratadine (CLARITIN) 10 MG tablet Take 10 mg by mouth daily.     lovastatin (MEVACOR) 40 MG tablet Take 40 mg by mouth daily.     metFORMIN (GLUMETZA) 500 MG (MOD) 24 hr tablet Take 500 mg by mouth daily with breakfast.     midodrine (PROAMATINE) 2.5 MG tablet Take 1 tablet (2.5 mg total) by mouth 3 (three) times daily with meals. (Patient not taking: Reported on 08/30/2020) 90 tablet 2   Oxcarbazepine (TRILEPTAL) 300 MG tablet Take 150-300 mg by mouth See admin instructions. Take 150 mg by mouth in the morning, take 150 mg by mouth in the afternoon and take 300 mg by mouth at bedtime  (Patient not taking: Reported on 08/30/2020)     tolterodine (DETROL) 2 MG tablet Take 2 mg by mouth 2 (two) times daily.     No current facility-administered medications for this visit.    Allergies:   Bee venom and Valdecoxib   Social History:  The patient  reports that he quit smoking about 32 years ago. His smoking use included cigarettes. He has never used smokeless tobacco. He reports current alcohol use. He reports that he does not use drugs.   Family History:  The patient's family history includes Alzheimer's disease in his father; Cancer in his brother and father; Deep vein thrombosis in his brother; Diabetes in his mother; Heart attack in his mother; Heart disease in his mother.   ROS:  Please see the history of present illness.   Otherwise, review of systems is positive for none.   All other systems are reviewed and negative.   PHYSICAL EXAM: VS:  There were no vitals taken for this visit. , BMI There is no height or weight on file to calculate BMI. GEN: Well nourished, well developed, in no acute distress  HEENT: normal  Neck: no JVD, carotid bruits, or masses Cardiac: ***RRR; no murmurs, rubs, or gallops,no edema  Respiratory:  clear to auscultation bilaterally, normal work of breathing GI: soft, nontender, nondistended, + BS MS: no deformity or atrophy  Skin: warm and dry, device site well healed Neuro:  Strength and sensation are intact Psych: euthymic mood, full affect  EKG:  EKG {ACTION; IS/IS [id number]} ordered today. Personal review of the ekg ordered *** shows ***  Personal review of the device interrogation today. Results in [place]: No results found for requested labs within last 8760 hours.    Lipid Panel     Component Value Date/Time   CHOL 177 11/15/2017 1901   TRIG 141 11/15/2017 1901   HDL 33 (L) 11/15/2017 1901   CHOLHDL 5.4 11/15/2017 1901   VLDL 28 11/15/2017 1901   LDLCALC 116 (H) 11/15/2017 1901   LDLDIRECT 97.2 11/30/2013  1336     Wt Readings from Last 3 Encounters:  08/30/20 195 lb (88.5 kg)  06/30/20 196 lb (88.9 kg)  02/22/19 197 lb (89.4 kg)      Other studies Reviewed: Additional studies/ records that were reviewed today include: TTE 11/15/17  Review of the above records today demonstrates:  - Left ventricle: The cavity size was normal. Wall thickness was   increased in a pattern of mild LVH. Systolic function was normal.   The estimated ejection fraction was 55%. Although no diagnostic   regional wall motion abnormality was identified, this possibility   cannot be completely excluded on the basis of this study. Doppler   parameters are consistent  with abnormal left ventricular   relaxation (grade 1 diastolic dysfunction). - Aortic valve: Mildly calcified annulus. Trileaflet; mildly   calcified leaflets. There was trivial regurgitation. - Mitral valve: Mildly calcified annulus. There was trivial   regurgitation. - Right atrium: Central venous pressure (est): 3 mm Hg. - Atrial septum: No defect or patent foramen ovale was identified. - Tricuspid valve: There was mild regurgitation. - Pulmonary arteries: PA peak pressure: 23 mm Hg (S). - Pericardium, extracardiac: There was no pericardial effusion.   ASSESSMENT AND PLAN:  1.  Syncope: Linq monitor implanted on 01/23/2018.***  2.  Hyperlipidemia: Continue lovastatin per primary physician.  Current medicines are reviewed at length with the patient today.   The patient does not have concerns regarding his medicines.  The following changes were made today: ***  Labs/ tests ordered today include:  No orders of the defined types were placed in this encounter.    Disposition:   FU with Jameshia Hayashida ***  Signed, Edwyna Dangerfield Meredith Leeds, MD  12/26/2020 8:40 AM     [facility] [address] [place] [address] [phone] (office) [phone] (fax)

## 2021-01-29 ENCOUNTER — Ambulatory Visit (INDEPENDENT_AMBULATORY_CARE_PROVIDER_SITE_OTHER): Payer: Medicare HMO

## 2021-01-29 DIAGNOSIS — R55 Syncope and collapse: Secondary | ICD-10-CM | POA: Diagnosis not present

## 2021-01-29 LAB — CUP PACEART REMOTE DEVICE CHECK
Date Time Interrogation Session: 20230123001649
Implantable Pulse Generator Implant Date: 20200117

## 2021-02-09 NOTE — Progress Notes (Signed)
Carelink Summary Report / Loop Recorder 

## 2021-02-28 ENCOUNTER — Telehealth: Payer: Self-pay | Admitting: Cardiology

## 2021-02-28 NOTE — Telephone Encounter (Signed)
Left message for patient advising that I have removed him from [facility] and cancelled future transmissions.  If we need to get data from his device before the battery runs out then we can do so in the office.

## 2021-02-28 NOTE — Telephone Encounter (Signed)
° °  Pt said when he got his loop recorder in 2020 it says the battery life is about 2 years, his loop recorder is almost 3 years not and since he got it he never had any problem, since he is being charge for $74 for monitoring his loop recorder he wanted to know if he still need it. He said he is going out and he unable to answer phone to leave him a message

## 2021-03-02 NOTE — Progress Notes (Incomplete)
Cardiology Office Note:    Date:  03/02/2021   ID:  Edward English, DOB 05-Jan-1934, MRN [id number]  PCP:  Edward Hose, NP   [facility] Providers Cardiologist:  Edward Rouge, MD Electrophysiologist:  Edward Meredith Leeds, MD      Referring MD: Edward Hose, NP   Chief Complaint:  No chief complaint on file.    Patient Profile:    Edward English is a 88 y.o. male with:  Syncope S/p ILR in 2020 NSVT noted on ILR in 04/2020 Diabetes mellitus  Hyperlipidemia  BPH  Lumbar stenosis Orthostatic hypotension Cymbalta and Flomax DC'd in past Diverticulosis Erectile dysfunction Hx of COVID-19 in 05/2020 Peripheral neuropathy   Prior CV studies: Echocardiogram 07/24/20 EF 50-55, no RWMA, GR 1 DD, RVSP 19, normal RVSF, AV sclerosis without stenosis   History of Present Illness: Edward English has no cardiac issues other than orthostasis   His urologist stopped his Uroxatral prior to this appt. Recommendation was adequate hydration and the addition of midodrine.  The dose has been increased to three times a day since due to low BPs.  A f/u echocardiogram showed low normal EF.  He returns for f/u.  He is here alone.  He did not notice much benefit with taking Midodrine and stopped this on his own.  He has a lot of leg symptoms related to neuropathy.  He has a       Past Medical History:  Diagnosis Date   BENIGN PROSTATIC HYPERTROPHY, HX OF 02/17/2007   Qualifier: Diagnosis of  By: [place], Edward English     DIVERTICULITIS-COLON 02/04/2008   Qualifier: Diagnosis of  By: Sharlett Iles MD Cline Cools R    ERECTILE DYSFUNCTION, MILD 02/17/2007   Qualifier: Diagnosis of  By: [place], Edward English     HYPERGLYCEMIA 02/17/2007   Qualifier: Diagnosis of  By: [place], Edward English     HYPERLIPIDEMIA 02/17/2007   Qualifier: Diagnosis of  By: [place], Edward English     HYPOGONADISM 02/17/2007   Qualifier: Diagnosis of  By: [place], Edward English     OVERACTIVE BLADDER 02/17/2007    Qualifier: Diagnosis of  By: [place], Edward English     PERIPHERAL NEUROPATHY 02/17/2007   Qualifier: Diagnosis of  By: [place], Edward English     PERIPHERAL VASCULAR DISEASE 02/17/2007   Qualifier: Diagnosis of  By: [place], Edward English     Regional enteritis of large intestine ([place]) 06/23/2008   Qualifier: Diagnosis of  By: Sharlett Iles MD Byrd Hesselbach    TOBACCO ABUSE, HX OF 12/26/2009   Qualifier: Diagnosis of  By: Bullins CMA, [place], HX OF 02/17/2007   Qualifier: Diagnosis of  By: [place], Edward English      Current Medications: No outpatient medications have been marked as taking for the 03/06/21 encounter (Appointment) with Josue Hector, MD.     Allergies:   Bee venom and Valdecoxib   Social History   Tobacco Use   Smoking status: Former    Types: Cigarettes    Quit date: 11/22/1988    Years since quitting: 32.2   Smokeless tobacco: Never  Vaping Use   Vaping Use: Never used  Substance Use Topics   Alcohol use: Yes    Alcohol/week: 0.0 standard drinks    Comment: 1 beer/month   Drug use: No     Family Hx: The patient's family history includes Alzheimer's disease in his father; Cancer in his brother and father; Deep vein thrombosis  in his brother; Diabetes in his mother; Heart attack in his mother; Heart disease in his mother. There is no history of Colon cancer, Prostate cancer, Stroke, or Coronary artery disease.  ROS see HPI  EKGs/Labs/Other Test Reviewed:    EKG:     Recent Labs: No results found for requested labs within last 8760 hours.   Recent Lipid Panel Lab Results  Component Value Date/Time   CHOL 177 11/15/2017 07:01 PM   TRIG 141 11/15/2017 07:01 PM   HDL 33 (L) 11/15/2017 07:01 PM   LDLCALC 116 (H) 11/15/2017 07:01 PM   LDLDIRECT 97.2 11/30/2013 01:36 PM      Risk Assessment/Calculations:      Physical Exam:    VS:  There were no vitals taken for this visit.   No data found.    Wt Readings from Last 3 Encounters:   08/30/20 195 lb (88.5 kg)  06/30/20 196 lb (88.9 kg)  02/22/19 197 lb (89.4 kg)     Affect appropriate Healthy:  appears stated age 67: normal Neck supple with no adenopathy JVP normal no bruits no thyromegaly Lungs clear with no wheezing and good diaphragmatic motion Heart:  S1/S2 no murmur, no rub, gallop or click PMI normal Abdomen: benighn, BS positve, no tenderness, no AAA no bruit.  No HSM or HJR Distal pulses intact with no bruits No edema Neuro non-focal Skin warm and dry No muscular weakness       ASSESSMENT & PLAN:    1. Orthostatic hypotension 2. Syncope and collapse Orthostatic vital signs in the office today ***  He has not had much benefit from midodrine in past Mention of florinef ***  ILR with no arrhythmias noted and battery now exhausted        Dispo:   F/U PRN   Medication Adjustments/Labs and Tests Ordered: Current medicines are reviewed at length with the patient today.  Concerns regarding medicines are outlined above.  Tests Ordered: No orders of the defined types were placed in this encounter.  Medication Changes: No orders of the defined types were placed in this encounter.   Signed, Edward Rouge, MD  03/02/2021 7:43 AM    [address], [place], [place]  [address] Phone: [phone]; Fax: [phone]

## 2021-03-06 ENCOUNTER — Ambulatory Visit: Payer: Medicare HMO | Admitting: Cardiovascular Disease

## 2021-05-30 NOTE — Progress Notes (Signed)
Cardiology Office Note:    Date:  06/07/2021   ID:  Edward English, DOB [birth year], MRN [id number]  PCP:  Tomasa Hose, NP   [facility] Providers Cardiologist:  New not seen in 4 years   Electrophysiologist:  Will Meredith Leeds, MD      Referring MD: Tomasa Hose, NP   Chief Complaint:  No chief complaint on file.    Patient Profile:    Edward English is a 87 y.o. male with:  Syncope S/p ILR in 2020 NSVT noted on ILR in 04/2020 Diabetes mellitus  Hyperlipidemia  BPH  Lumbar stenosis Orthostatic hypotension Cymbalta and Flomax DC'd in past Diverticulosis Erectile dysfunction Hx of COVID-19 in 05/2020 Peripheral neuropathy   Prior CV studies: Echocardiogram 07/24/20 EF 50-55, no RWMA, GR 1 DD, RVSP 19, normal RVSF, AV sclerosis without stenosis   History of Present Illness:  88 y.o. not seen in 4 years. Referred to general cardiology by EP Dr Curt Bears. History of orthostasis He was placed on midodrine and urology d/c his Uroxatral. Stopped midodrine on his on no benefit Peripheral neuropathy Does not wear compression stockings ILR placed by [place] 2020 and only asymptomatic NSVT noted 04/2020 He has DM, BPH, HLD EF has been normal 50-55% by TTE 07/24/20 AV sclerosis and normal RV       Past Medical History:  Diagnosis Date   BENIGN PROSTATIC HYPERTROPHY, HX OF 02/17/2007   Qualifier: Diagnosis of  By: [place], [place]     DIVERTICULITIS-COLON 02/04/2008   Qualifier: Diagnosis of  By: Sharlett Iles MD Cline Cools R    ERECTILE DYSFUNCTION, MILD 02/17/2007   Qualifier: Diagnosis of  By: [place], [place]     HYPERGLYCEMIA 02/17/2007   Qualifier: Diagnosis of  By: [place], [place]     HYPERLIPIDEMIA 02/17/2007   Qualifier: Diagnosis of  By: [place], [place]     HYPOGONADISM 02/17/2007   Qualifier: Diagnosis of  By: Danny Lawless CMA, [place]     OVERACTIVE BLADDER 02/17/2007   Qualifier: Diagnosis of  By: [place], [place]     PERIPHERAL  NEUROPATHY 02/17/2007   Qualifier: Diagnosis of  By: [place], [place]     PERIPHERAL VASCULAR DISEASE 02/17/2007   Qualifier: Diagnosis of  By: [place], [place]     Regional enteritis of large intestine ([place]) 06/23/2008   Qualifier: Diagnosis of  By: Sharlett Iles MD FACG, [place], HX OF 12/26/2009   Qualifier: Diagnosis of  By: Bullins CMA, [place], HX OF 02/17/2007   Qualifier: Diagnosis of  By: Danny Lawless CMA, [place]      Current Medications: No outpatient medications have been marked as taking for the 06/07/21 encounter (Appointment) with Josue Hector, MD.     Allergies:   Bee venom and Valdecoxib   Social History   Tobacco Use   Smoking status: Former    Types: Cigarettes    Quit date: 11/22/1988    Years since quitting: 32.5   Smokeless tobacco: Never  Vaping Use   Vaping Use: Never used  Substance Use Topics   Alcohol use: Yes    Alcohol/week: 0.0 standard drinks    Comment: 1 beer/month   Drug use: No     Family Hx: The patient's family history includes Alzheimer's disease in his father; Cancer in his brother and father; Deep vein thrombosis in his brother; Diabetes in his mother; Heart attack in his mother; Heart disease in his mother.  There is no history of Colon cancer, Prostate cancer, Stroke, or Coronary artery disease.  ROS see HPI  EKGs/Labs/Other Test Reviewed:    EKG:   06/30/20 SR no acute ST changes 06/30/20 06/07/2021 SR rate 95 nonspecific ST changes   Recent Labs: No results found for requested labs within last 8760 hours.   Recent Lipid Panel Lab Results  Component Value Date/Time   CHOL 177 11/15/2017 07:01 PM   TRIG 141 11/15/2017 07:01 PM   HDL 33 (L) 11/15/2017 07:01 PM   LDLCALC 116 (H) 11/15/2017 07:01 PM   LDLDIRECT 97.2 11/30/2013 01:36 PM      Risk Assessment/Calculations:      Physical Exam:    VS:  There were no vitals taken for this visit.   No data found.    Wt Readings from Last 3  Encounters:  08/30/20 195 lb (88.5 kg)  06/30/20 196 lb (88.9 kg)  02/22/19 197 lb (89.4 kg)     Affect appropriate Healthy:  appears stated age 79: normal Neck supple with no adenopathy JVP normal no bruits no thyromegaly Lungs clear with no wheezing and good diaphragmatic motion Heart:  S1/S2 no murmur, no rub, gallop or click PMI normal Abdomen: benighn, BS positve, no tenderness, no AAA no bruit.  No HSM or HJR Distal pulses intact with no bruits No edema Neuro non-focal Skin warm and dry No muscular weakness       ASSESSMENT & PLAN:    Dizziness/Orthostasis:  EF low normal not postural in office In past Cymbalta / Flomax/Uroxatril d/c Discussed slow positional changes , compression stockings  No arrhythmia on ILR  Last PACEART 01/29/21  Weaning his neurontin since feet don't hurt and can contribute to dizziness  Neuropathy:  ? From DM can't feel bottom of his feet on elavil Consider primary f/u and head CT to r/o NPH DM:  Discussed low carb diet.  Target hemoglobin A1c is 6.5 or less.  Continue current medications. HLD:  continue statin no recent evaluation for CAD no chest pain observe        Dispo  F/U in a year   Medication Adjustments/Labs and Tests Ordered: Current medicines are reviewed at length with the patient today.  Concerns regarding medicines are outlined above.  Tests Ordered: No orders of the defined types were placed in this encounter.  Medication Changes: No orders of the defined types were placed in this encounter.   Signed, Jenkins Rouge, MD  06/07/2021 11:02 AM    [facility] [place], [place], [place]  [address] Phone: [phone]; Fax: [phone]

## 2021-06-07 ENCOUNTER — Ambulatory Visit: Payer: Medicare HMO | Admitting: Cardiovascular Disease

## 2021-06-07 ENCOUNTER — Encounter: Payer: Self-pay | Admitting: Cardiovascular Disease

## 2021-06-07 VITALS — BP 112/72 | HR 95 | Ht 69.0 in | Wt 191.0 lb

## 2021-06-07 DIAGNOSIS — R55 Syncope and collapse: Secondary | ICD-10-CM

## 2021-06-07 DIAGNOSIS — I951 Orthostatic hypotension: Secondary | ICD-10-CM | POA: Diagnosis not present

## 2021-06-07 NOTE — Patient Instructions (Addendum)
Medication Instructions:  Your physician recommends that you continue on your current medications as directed. Please refer to the Current Medication list given to you today.  *If you need a refill on your cardiac medications before your next appointment, please call your pharmacy*  Lab Work: If you have labs (blood work) drawn today and your tests are completely normal, you will receive your results only by: [place] (if you have MyChart) OR A paper copy in the mail If you have any lab test that is abnormal or we need to change your treatment, we will call you to review the results.  Testing/Procedures: None ordered today.  Follow-Up: At [facility], you and your health needs are our priority.  As part of our continuing mission to provide you with exceptional heart care, we have created designated Provider Care Teams.  These Care Teams include your primary Cardiologist (physician) and Advanced Practice Providers (APPs -  Physician Assistants and Nurse Practitioners) who all work together to provide you with the care you need, when you need it.  We recommend signing up for the patient portal called "MyChart".  Sign up information is provided on this After Visit Summary.  MyChart is used to connect with patients for Virtual Visits (Telemedicine).  Patients are able to view lab/test results, encounter notes, upcoming appointments, etc.  Non-urgent messages can be sent to your provider as well.   To learn more about what you can do with MyChart, go to [email or web address].    Your next appointment:   12 month(s)  The format for your next appointment:   In Person  Provider:   Jenkins Rouge, MD {   Important Information About Sugar

## 2021-06-08 NOTE — Addendum Note (Signed)
Addended by: Jacinta Shoe on: 06/08/2021 01:32 PM   Modules accepted: Orders

## 2022-03-06 DIAGNOSIS — Z85828 Personal history of other malignant neoplasm of skin: Secondary | ICD-10-CM | POA: Diagnosis not present

## 2022-03-06 DIAGNOSIS — C4442 Squamous cell carcinoma of skin of scalp and neck: Secondary | ICD-10-CM | POA: Diagnosis not present

## 2022-03-06 DIAGNOSIS — C4441 Basal cell carcinoma of skin of scalp and neck: Secondary | ICD-10-CM | POA: Diagnosis not present

## 2022-03-06 DIAGNOSIS — D485 Neoplasm of uncertain behavior of skin: Secondary | ICD-10-CM | POA: Diagnosis not present

## 2022-03-06 DIAGNOSIS — L570 Actinic keratosis: Secondary | ICD-10-CM | POA: Diagnosis not present

## 2022-03-23 ENCOUNTER — Emergency Department (HOSPITAL_COMMUNITY): Payer: Medicare HMO

## 2022-03-23 ENCOUNTER — Emergency Department (HOSPITAL_COMMUNITY)
Admission: EM | Admit: 2022-03-23 | Discharge: 2022-03-23 | Disposition: A | Discharge status: Home / Self Care | Payer: Medicare HMO | Attending: Emergency Medicine | Admitting: Emergency Medicine

## 2022-03-23 ENCOUNTER — Encounter (HOSPITAL_COMMUNITY): Payer: Self-pay | Admitting: *Deleted

## 2022-03-23 ENCOUNTER — Other Ambulatory Visit: Payer: Self-pay

## 2022-03-23 DIAGNOSIS — R55 Syncope and collapse: Secondary | ICD-10-CM | POA: Diagnosis present

## 2022-03-23 DIAGNOSIS — R109 Unspecified abdominal pain: Secondary | ICD-10-CM | POA: Insufficient documentation

## 2022-03-23 LAB — CBC WITH DIFFERENTIAL/PLATELET
Abs Immature Granulocytes: 0.02 10*3/uL (ref 0.00–0.07)
Basophils Absolute: 0.1 10*3/uL (ref 0.0–0.1)
Basophils Relative: 1 %
Eosinophils Absolute: 0 10*3/uL (ref 0.0–0.5)
Eosinophils Relative: 1 %
HCT: 37.7 % — ABNORMAL LOW (ref 39.0–52.0)
Hemoglobin: 12.4 g/dL — ABNORMAL LOW (ref 13.0–17.0)
Immature Granulocytes: 0 %
Lymphocytes Relative: 12 %
Lymphs Abs: 1 10*3/uL (ref 0.7–4.0)
MCH: 32.9 pg (ref 26.0–34.0)
MCHC: 32.9 g/dL (ref 30.0–36.0)
MCV: 100 fL (ref 80.0–100.0)
Monocytes Absolute: 0.8 10*3/uL (ref 0.1–1.0)
Monocytes Relative: 9 %
Neutro Abs: 6.4 10*3/uL (ref 1.7–7.7)
Neutrophils Relative %: 77 %
Platelets: 208 10*3/uL (ref 150–400)
RBC: 3.77 MIL/uL — ABNORMAL LOW (ref 4.22–5.81)
RDW: 14.1 % (ref 11.5–15.5)
WBC: 8.3 10*3/uL (ref 4.0–10.5)
nRBC: 0 % (ref 0.0–0.2)

## 2022-03-23 LAB — COMPREHENSIVE METABOLIC PANEL
ALT: 15 U/L (ref 0–44)
AST: 20 U/L (ref 15–41)
Albumin: 3.5 g/dL (ref 3.5–5.0)
Alkaline Phosphatase: 35 U/L — ABNORMAL LOW (ref 38–126)
Anion gap: 13 (ref 5–15)
BUN: 17 mg/dL (ref 8–23)
CO2: 22 mmol/L (ref 22–32)
Calcium: 8.6 mg/dL — ABNORMAL LOW (ref 8.9–10.3)
Chloride: 103 mmol/L (ref 98–111)
Creatinine, Ser: 1.44 mg/dL — ABNORMAL HIGH (ref 0.61–1.24)
GFR, Estimated: 47 mL/min — ABNORMAL LOW (ref >60)
Glucose, Bld: 130 mg/dL — ABNORMAL HIGH (ref 70–99)
Potassium: 4.1 mmol/L (ref 3.5–5.1)
Sodium: 138 mmol/L (ref 135–145)
Total Bilirubin: 1 mg/dL (ref 0.3–1.2)
Total Protein: 6 g/dL — ABNORMAL LOW (ref 6.5–8.1)

## 2022-03-23 LAB — TROPONIN I (HIGH SENSITIVITY)
Troponin I (High Sensitivity): 4 ng/L (ref <18)
Troponin I (High Sensitivity): 4 ng/L (ref <18)

## 2022-03-23 LAB — CBG MONITORING, ED: Glucose-Capillary: 133 mg/dL — ABNORMAL HIGH (ref 70–99)

## 2022-03-23 MED ORDER — SODIUM CHLORIDE 0.9 % IV BOLUS
1000.0000 mL | Freq: Once | INTRAVENOUS | Status: AC
  Administered 2022-03-23: 1000 mL via INTRAVENOUS

## 2022-03-23 NOTE — ED Triage Notes (Signed)
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

## 2022-03-23 NOTE — Discharge Instructions (Addendum)
You have been seen today for your complaint of syncope. Your lab work was overall very reassuring. Your imaging was reassuring and showed no abnormalities. Your discharge medications include your home medications. Home care instructions are as follows:  Change positions slowly.  Eat a normal diet.  Drink plenty of water. Follow up with: Cardiology. They will call you to schedule an appointment Please seek immediate medical care if you develop any of the following symptoms: You faint. You hit your head or are injured after fainting. You have any of these symptoms that may indicate trouble with your heart: Fast or irregular heartbeats (palpitations). Unusual pain in your chest, abdomen, or back. Shortness of breath. You have a seizure. You have a severe headache. You are confused. You have vision problems. You have severe weakness or trouble walking. You are bleeding from your mouth or rectum, or you have black or tarry stool. At this time there does not appear to be the presence of an emergent medical condition, however there is always the potential for conditions to change. Please read and follow the below instructions.  Do not take your medicine if  develop an itchy rash, swelling in your mouth or lips, or difficulty breathing; call 911 and seek immediate emergency medical attention if this occurs.  You may review your lab tests and imaging results in their entirety on your MyChart account.  Please discuss all results of fully with your primary care provider and other specialist at your follow-up visit.  Note: Portions of this text may have been transcribed using voice recognition software. Every effort was made to ensure accuracy; however, inadvertent computerized transcription errors may still be present.

## 2022-03-23 NOTE — ED Provider Notes (Signed)
[facility] Provider Note   CSN: [id number] Arrival date & time: 03/23/22  1154     History  Chief Complaint  Patient presents with   Abdominal Pain   Near Syncope    Edward English is a 89 y.o. male.  With history of BPH, hyperlipidemia, peripheral vascular disease, peripheral neuropathy, renal insufficiency who presents to ED via EMS for evaluation of a syncopal episode.  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  Approximately 30 minutes prior to arrival he states that everything started to go dark and he felt flushed and lightheaded.  He was lowered to the ground by bystanders.  Bystanders reported possible brief loss of consciousness and no seizure-like activity.  He did not fall or hit his head.  Does not take blood thinners. Did not lose bowel or bladder function.  He did not have any prodromal symptoms of chest pain, shortness of breath, headaches.  He states this has happened numerous times in the past.  States Most recently he was brought to the ED 2 years ago.  He states he has had similar episodes since then but has not been seen for them.  He reports eating breakfast this morning and drinking like normal.  Denies cardiac history. He was given 500 cc normal saline by EMS and reports feeling significantly better.  Currently denies all symptoms.  Initially did not want to come to the ED but was found to have orthostatic hypotension by EMS on scene. No history of CHF.    Abdominal Pain Near Syncope       Home Medications Prior to Admission medications   Medication Sig Start Date End Date Taking? Authorizing Provider  acetaminophen (TYLENOL) 500 MG tablet Take 1,000 mg by mouth every 6 (six) hours as needed for moderate pain or headache.     [provider]  amitriptyline (ELAVIL) 10 MG tablet Take 10 mg by mouth 3 (three) times daily.    [provider]  gabapentin (NEURONTIN)  100 MG capsule Take 100 mg by mouth 2 (two) times daily. 08/16/20   [provider]  loratadine (CLARITIN) 10 MG tablet Take 10 mg by mouth daily. 02/16/19   [provider]  lovastatin (MEVACOR) 40 MG tablet Take 40 mg by mouth daily.    [provider]  metFORMIN (GLUMETZA) 500 MG (MOD) 24 hr tablet Take 500 mg by mouth daily with breakfast.    [provider]  midodrine (PROAMATINE) 2.5 MG tablet Take 1 tablet (2.5 mg total) by mouth 3 (three) times daily with meals. Patient not taking: Reported on 08/30/2020 08/10/20   Loel Dubonnet, NP  Oxcarbazepine (TRILEPTAL) 300 MG tablet Take 150-300 mg by mouth See admin instructions. Take 150 mg by mouth in the morning, take 150 mg by mouth in the afternoon and take 300 mg by mouth at bedtime Patient not taking: Reported on 08/30/2020    [provider]  tolterodine (DETROL) 2 MG tablet Take 2 mg by mouth 2 (two) times daily.    [provider]      Allergies    Bee venom and Valdecoxib    Review of Systems   Review of Systems  Cardiovascular:  Positive for near-syncope.  Neurological:  Positive for syncope.  All other systems reviewed and are negative.   Physical Exam Updated Vital Signs BP (!) 140/63   Pulse 85   Temp 97.9 F (36.6 C) (  Oral)   Resp 15   Ht 5\' 10"  (1.778 m)   Wt 86.2 kg   SpO2 92%   BMI 27.26 kg/m  Physical Exam Vitals and nursing note reviewed.  Constitutional:      General: He is not in acute distress.    Appearance: He is well-developed. He is not ill-appearing, toxic-appearing or diaphoretic.     Comments: Resting comfortably in bed  HENT:     Head: Normocephalic and atraumatic.  Eyes:     Conjunctiva/sclera: Conjunctivae normal.  Cardiovascular:     Rate and Rhythm: Normal rate and regular rhythm.     Heart sounds: No murmur heard. Pulmonary:     Effort: Pulmonary effort is normal. No respiratory distress.     Breath sounds: Normal breath sounds. No  stridor. No wheezing, rhonchi or rales.  Abdominal:     Palpations: Abdomen is soft.     Tenderness: There is no abdominal tenderness. There is no guarding. Negative signs include Murphy's sign and McBurney's sign.  Musculoskeletal:        General: No swelling.     Cervical back: Neck supple.  Skin:    General: Skin is warm and dry.     Capillary Refill: Capillary refill takes less than 2 seconds.  Neurological:     General: No focal deficit present.     Mental Status: He is alert and oriented to person, place, and time.     Comments: No facial asymmetry, slurred speech, unilateral or global weakness, pronator drift.  Normal finger-to-nose, heel-to-shin and shoulder shrug.  Sensation intact face and bilateral upper extremities.  Decreased sensation to bilateral lower extremities which is typical for patient.  Psychiatric:        Mood and Affect: Mood normal.        Behavior: Behavior normal.     ED Results / Procedures / Treatments   Labs (all labs ordered are listed, but only abnormal results are displayed) Labs Reviewed  CBC WITH DIFFERENTIAL/PLATELET - Abnormal; Notable for the following components:      Result Value   RBC 3.77 (*)    Hemoglobin 12.4 (*)    HCT 37.7 (*)    All other components within normal limits  COMPREHENSIVE METABOLIC PANEL - Abnormal; Notable for the following components:   Glucose, Bld 130 (*)    Creatinine, Ser 1.44 (*)    Calcium 8.6 (*)    Total Protein 6.0 (*)    Alkaline Phosphatase 35 (*)    GFR, Estimated 47 (*)    All other components within normal limits  CBG MONITORING, ED - Abnormal; Notable for the following components:   Glucose-Capillary 133 (*)    All other components within normal limits  TROPONIN I (HIGH SENSITIVITY)  TROPONIN I (HIGH SENSITIVITY)    EKG EKG Interpretation  Date/Time:  Saturday March 23 2022 12:01:29 EDT Ventricular Rate:  84 PR Interval:  195 QRS Duration: 102 QT Interval:  391 QTC Calculation: 463 R  Axis:   -16 Text Interpretation: Sinus rhythm Borderline left axis deviation Low voltage, precordial leads Abnormal R-wave progression, early transition Borderline T wave abnormalities Confirmed by Godfrey Pick [phone]) on 03/23/2022 12:49:07 PM  Radiology CT HEAD WO CONTRAST  Result Date: 03/23/2022 CLINICAL DATA:  Syncope/presyncope. EXAM: CT HEAD WITHOUT CONTRAST TECHNIQUE: Contiguous axial images were obtained from the base of the skull through the vertex without intravenous contrast. RADIATION DOSE REDUCTION: This exam was performed according to the departmental dose-optimization program which includes automated  exposure control, adjustment of the mA and/or kV according to patient size and/or use of iterative reconstruction technique. COMPARISON:  None Available. FINDINGS: Brain: Ventricles, cisterns and other CSF spaces are normal. There is moderate chronic ischemic microvascular disease. No mass, mass effect, shift of midline structures or acute hemorrhage. No evidence of acute infarction. Vascular: No hyperdense vessel or unexpected calcification. Skull: Normal. Negative for fracture or focal lesion. Sinuses/Orbits: Orbits are normal and symmetric. Paranasal sinuses well developed. There is chronic inflammatory change over the maxillary sinuses. Other: None. IMPRESSION: 1. No acute findings. 2. Moderate chronic ischemic microvascular disease. 3. Chronic inflammatory change over the maxillary sinuses. Electronically Signed   By: Marin Olp M.D.   On: 03/23/2022 14:28   DG Chest 2 View  Result Date: 03/23/2022 CLINICAL DATA:  Syncope EXAM: CHEST - 2 VIEW COMPARISON:  None Available. FINDINGS: Normal mediastinum and cardiac silhouette. Normal pulmonary vasculature. No evidence of effusion, infiltrate, or pneumothorax. No acute bony abnormality. Cardiac recorder device noted IMPRESSION: No acute cardiopulmonary process. Electronically Signed   By: Suzy Bouchard M.D.   On: 03/23/2022 12:37     Procedures Procedures    Medications Ordered in ED Medications  sodium chloride 0.9 % bolus 1,000 mL (1,000 mLs Intravenous New Bag/Given 03/23/22 1417)    ED Course/ Medical Decision Making/ A&P                       [facility] Syncope Rule Score: 0      Medical Decision Making This patient presents to the ED for concern of syncopal episode, this involves an extensive number of treatment options, and is a complaint that carries with it a high risk of complications and morbidity.  The differential for syncope is extensive and includes, but is not limited to: arrythmia (Vtach, SVT, SSS, sinus arrest, AV block, bradycardia) aortic stenosis, AMI, HOCM, PE, atrial myxoma, pulmonary hypertension, orthostatic hypotension, (hypovolemia, drug effect, GB syndrome, micturition, cough, swall) carotid sinus sensitivity, Seizure, TIA/CVA, hypoglycemia,  Vertigo.  Co morbidities that complicate the patient evaluation  BPH, hyperlipidemia, peripheral vascular disease, peripheral neuropathy, renal insufficiency  My initial workup includes labs, EKG, imaging, IV fluids  Additional history obtained from: Nursing notes from this visit. Previous records within EMR system nearly identical presentation on 11/15/2017. EMS provides a portion of the history  I ordered, reviewed and interpreted labs which include: CBC, CMP, troponin  I ordered imaging studies including chest x-ray, CT head I independently visualized and interpreted imaging which showed negative chest x-ray and CT head I agree with the radiologist interpretation  Cardiac Monitoring:  The patient was maintained on a cardiac monitor.  I personally viewed and interpreted the cardiac monitored which showed an underlying rhythm of: NSR  Afebrile, hemodynamically stable.  89 year old male presenting to the ED for evaluation of a syncopal episode.  This is not a new sensation to him.  States it happens quite frequently.  Chart review  reveals an almost identical presentation 4 years ago.  A cause for his syncope and near syncopal episodes has not yet been found.  He has a reassuring physical exam today.  No heart murmur appreciated.  Orthostatic vitals were performed in the ED and were stable.  He was given an additional bolus of IV fluids and reported feeling better after this.  Shared decision-making conversation was had with patient regarding admission versus discharge.  He states he would like to be discharged and follow-up with his primary cardiologist.  I believe this is reasonable.  He has a [place] syncope rule score of 0.  He states he is able to call and schedule an appointment with this provider.  He was encouraged to change positions slowly and walk with an assistive device if needed.  He was given strict return precautions.  He was encouraged to eat a normal diet and drink plenty of water.  He was given strict return precautions.  Stable at discharge.  At this time there does not appear to be any evidence of an acute emergency medical condition and the patient appears stable for discharge with appropriate outpatient follow up. Diagnosis was discussed with patient who verbalizes understanding of care plan and is agreeable to discharge. I have discussed return precautions with patient who verbalizes understanding. Patient encouraged to follow-up with their PCP within 1 week. All questions answered.  Patient's case discussed with Dr. Doren Custard who agrees with plan to discharge with follow-up.   Note: Portions of this report may have been transcribed using voice recognition software. Every effort was made to ensure accuracy; however, inadvertent computerized transcription errors may still be present.        Final Clinical Impression(s) / ED Diagnoses Final diagnoses:  Syncope and collapse    Rx / DC Orders ED Discharge Orders          Ordered    Ambulatory referral to Cardiology       Comments: If you have not  heard from the Cardiology office within the next 72 hours please call [phone].   03/23/22 1555              Roylene Reason, PA-C 03/23/22 1557    Godfrey Pick, MD 03/24/22 1150

## 2022-03-31 NOTE — Progress Notes (Unsigned)
Cardiology Office Note:    Date:  04/04/2022   ID:  HASAAN TREVOR, DOB Oct 13, 1933, MRN [id number]  PCP:  Patient, No Pcp Per   [facility] Prov[facility] Cardiologist:  Johnsie Cancel  Electrophysiologist:  Will Meredith Leeds, MD      Referring MD: Roylene Reason, [place]*   Chief Complaint: Salvatore Decent     Patient Profile:    DARSHAWN STEWART is a 89 y.o. male with:  Syncope S/p ILR in 2020 NSVT noted on ILR in 04/2020 Diabetes mellitus  Hyperlipidemia  BPH  Lumbar stenosis Orthostatic hypotension Cymbalta and Flomax DC'd in past Diverticulosis Erectile dysfunction Hx of COVID-19 in 05/2020 Peripheral neuropathy   Prior CV studies: Echocardiogram 07/24/20 EF 50-55, no RWMA, GR 1 DD, RVSP 19, normal RVSF, AV sclerosis without stenosis   History of Present Illness:  89 y.o. . Referred to general cardiology by EP Dr Curt Bears. History of orthostasis He was placed on midodrine and urology d/c his Uroxatral. Stopped midodrine on his on no benefit Peripheral neuropathy Does not wear compression stockings ILR placed by [place] 2020 and only asymptomatic NSVT noted 04/2020 He has DM, BPH, HLD EF has been normal 50-55% by TTE 07/24/20 AV sclerosis and normal RV   Seen in ED 03/23/22 for syncope and abdominal pain. Was volunteering at [employer] out food. Felt darkness with flushing and lightheaded No seizure activity No trauma ? 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      Past Medical  History:  Diagnosis Date   [place], HX OF 02/17/2007   Qualifier: Diagnosis of  By: [place], [place]     DIVERTICULITIS-COLON 02/04/2008   Qualifier: Diagnosis of  By: Sharlett Iles MD Cline Cools R    ERECTILE DYSFUNCTION, MILD 02/17/2007   Qualifier: Diagnosis of  By: [place], [place]     HYPERGLYCEMIA 02/17/2007   Qualifier: Diagnosis of  By: [place], [place]     HYPERLIPIDEMIA 02/17/2007   Qualifier: Diagnosis of  By: [place], [place]     HYPOGONADISM 02/17/2007   Qualifier: Diagnosis of  By: [place], [place]     OVERACTIVE BLADDER 02/17/2007   Qualifier: Diagnosis of  By: [place], [place]     PERIPHERAL NEUROPATHY 02/17/2007   Qualifier: Diagnosis of  By: [place], [place]     PERIPHERAL VASCULAR DISEASE 02/17/2007   Qualifier: Diagnosis of  By: [place], [place]     Regional enteritis of large intestine ([place]) 06/23/2008   Qualifier: Diagnosis of  By: Sharlett Iles MD Byrd Hesselbach    TOBACCO ABUSE, HX OF 12/26/2009   Qualifier: Diagnosis of  By: [place] CMA, [place], HX OF 02/17/2007   Qualifier: Diagnosis of  By: Danny Lawless CMA, [place]      Current Medications: Current Meds  Medication Sig   acetaminophen (TYLENOL) 500 MG tablet Take 1,000 mg by mouth every 6 (six) hours as needed for moderate pain or  headache.    amitriptyline (ELAVIL) 50 MG tablet Take 50 mg by mouth daily.   lovastatin (MEVACOR) 40 MG tablet Take 40 mg by mouth daily.   metFORMIN (GLUMETZA) 500 MG (MOD) 24 hr tablet Take 500 mg by mouth daily with breakfast.   tolterodine (DETROL) 2 MG tablet Take 2 mg by mouth 2 (two) times daily.   [DISCONTINUED] amitriptyline (ELAVIL) 10 MG tablet Take 10 mg by mouth 3 (three) times daily.     Allergies:   Bee venom and Valdecoxib   Social History   Tobacco Use   Smoking status: Former    Types: Cigarettes    Quit date: 11/22/1988    Years since quitting: 33.3   Smokeless tobacco: Never  Vaping Use   Vaping Use: Never  used  Substance Use Topics   Alcohol use: Not Currently    Comment: 1 beer/month   Drug use: No     Family Hx: The patient's family history includes Alzheimer's disease in his father; Cancer in his brother and father; Deep vein thrombosis in his brother; Diabetes in his mother; Heart attack in his mother; Heart disease in his mother. There is no history of Colon cancer, Prostate cancer, Stroke, or Coronary artery disease.  ROS see HPI  EKGs/Labs/Other Test Reviewed:    EKG:   06/30/20 SR no acute ST changes 06/30/20 04/04/2022 SR rate 95 nonspecific ST changes   Recent Labs: 03/23/2022: ALT 15; BUN 17; Creatinine, Ser 1.44; Hemoglobin 12.4; Platelets 208; Potassium 4.1; Sodium 138   Recent Lipid Panel Lab Results  Component Value Date/Time   CHOL 177 11/15/2017 07:01 PM   TRIG 141 11/15/2017 07:01 PM   HDL 33 (L) 11/15/2017 07:01 PM   LDLCALC 116 (H) 11/15/2017 07:01 PM   LDLDIRECT 97.2 11/30/2013 01:36 PM      Risk Assessment/Calculations:      Physical Exam:    VS:  BP (!) 102/58   Pulse 97   Ht 5\' 10"  (1.778 m)   Wt 196 lb 12.8 oz (89.3 kg)   SpO2 94%   BMI 28.24 kg/m    No data found.    Wt Readings from Last 3 Encounters:  04/04/22 196 lb 12.8 oz (89.3 kg)  03/23/22 190 lb (86.2 kg)  06/07/21 191 lb (86.6 kg)     Affect appropriate Healthy:  appears stated age HEENT: normal Neck supple with no adenopathy JVP normal no bruits no thyromegaly Lungs clear with no wheezing and good diaphragmatic motion Heart:  S1/S2 no murmur, no rub, gallop or click PMI normal Abdomen: benighn, BS positve, no tenderness, no AAA no bruit.  No HSM or HJR Distal pulses intact with no bruits No edema Neuro non-focal Skin warm and dry No muscular weakness       ASSESSMENT & PLAN:    Dizziness/Orthostasis:  EF low normal not postural in office In past Cymbalta / Flomax/Uroxatril d/c Discussed slow positional changes , compression stockings  No arrhythmia on ILR  Last  PACEART 01/29/21  Weaning his neurontin since feet don't hurt and can contribute to dizziness ER w/u negative Resume midodrinw 2.5 mg tid This is not primarily a cardiac issue  Neuropathy:  ? From DM can't feel bottom of his feet on elavil Consider primary f/u Head CT 03/23/22 no NPH  DM:  Discussed low carb diet.  Target hemoglobin A1c is 6.5 or less.  Continue current medications. HLD:  continue statin no recent evaluation for CAD no chest pain observe  Refer back to [facility] ? ILR removal and neurogenic syncope        Medication Adjustments/Labs and Tests Ordered: Current medicines are reviewed at length with the patient today.  Concerns regarding medicines are outlined above.  Tests Ordered: No orders of the defined types were placed in this encounter.  Medication Changes: No orders of the defined types were placed in this encounter. Midodrine 2.5 mg tid   Signed, Jenkins Rouge, MD  04/04/2022 11:34 AM    [facility] [address], [place], [place]  [address] Phone: [phone]; Fax: [phone]

## 2022-04-04 ENCOUNTER — Ambulatory Visit: Payer: Medicare HMO | Attending: Cardiovascular Disease | Admitting: Cardiovascular Disease

## 2022-04-04 ENCOUNTER — Encounter: Payer: Self-pay | Admitting: Cardiovascular Disease

## 2022-04-04 DIAGNOSIS — R42 Dizziness and giddiness: Secondary | ICD-10-CM | POA: Diagnosis not present

## 2022-04-04 DIAGNOSIS — I951 Orthostatic hypotension: Secondary | ICD-10-CM

## 2022-04-04 MED ORDER — MIDODRINE HCL 2.5 MG PO TABS: 2.5000 mg | ORAL_TABLET | Freq: Three times a day (TID) | ORAL | 6 refills | Status: AC

## 2022-04-04 NOTE — Patient Instructions (Addendum)
Medication Instructions:  Your physician recommends that you continue on your current medications as directed. Please refer to the Current Medication list given to you today.  *If you need a refill on your cardiac medications before your next appointment, please call your pharmacy*  Lab Work: If you have labs (blood work) drawn today and your tests are completely normal, you will receive your results only by: [place] (if you have MyChart) OR A paper copy in the mail If you have any lab test that is abnormal or we need to change your treatment, we will call you to review the results.  Testing/Procedures: None ordered today  Follow-Up: At [facility], you and your health needs are our priority.  As part of our continuing mission to provide you with exceptional heart care, we have created designated Provider Care Teams.  These Care Teams include your primary Cardiologist (physician) and Advanced Practice Providers (APPs -  Physician Assistants and Nurse Practitioners) who all work together to provide you with the care you need, when you need it.  We recommend signing up for the patient portal called "MyChart".  Sign up information is provided on this After Visit Summary.  MyChart is used to connect with patients for Virtual Visits (Telemedicine).  Patients are able to view lab/test results, encounter notes, upcoming appointments, etc.  Non-urgent messages can be sent to your provider as well.   To learn more about what you can do with MyChart, go to [email or web address].    Your next appointment:   Next available  Provider:   Dr. Curt Bears

## 2022-05-17 ENCOUNTER — Ambulatory Visit: Payer: Medicare HMO | Admitting: Cardiology

## 2022-07-26 ENCOUNTER — Telehealth: Payer: Self-pay | Admitting: Cardiovascular Disease

## 2022-07-26 NOTE — Telephone Encounter (Signed)
Pt c/o medication issue:  1. Name of Medication:   midodrine (PROAMATINE) 2.5 MG tablet    2. How are you currently taking this medication (dosage and times per day)?   Take 1 tablet (2.5 mg total) by mouth 3 (three) times daily with meals.    3. Are you having a reaction (difficulty breathing--STAT)? No   4. What is your medication issue? Pt states that his pharmacist made him aware that above medication should not be taken with another medication that he is currently on for his Prostate, Alfuzosin 10 MG. Pt would like a callback regarding this matter. Please advise

## 2022-07-26 NOTE — Telephone Encounter (Signed)
I do not see any interaction with alfuzosin and midodrine. Midodrine increases blood pressure. Technically alfluzosin can decrease it, but generally not a big effect.

## 2022-07-26 NOTE — Telephone Encounter (Signed)
Called patient with PharmD's advisement. Patient stated he has never started the medication. Asked patient if he is still having dizziness and low BPs when standing. Informed patient that this would help with those symptoms. Patient stated he would try it and see if it helps.

## 2023-01-13 NOTE — Progress Notes (Signed)
  Subjective:    Edward English. is a 89 y.o. (DOB 07-30-1933) male.     Patient presents with  . Hematuria    Pt presents today with blood in urine last Friday morning     Edward English presents with dark-colored urine.  He states that on Friday he woke up with stomach pain and vomited.  During that day he developed dark rusty colored urine.  He reports no fever.  No back pain.  He does have a remote history of kidney stones.     Reviewed and updated this visit by provider: Tobacco  Allergies  Meds  Problems  Med Hx  Surg Hx  Fam Hx       Review of Systems   Objective:   Vitals:   01/13/23 1525  BP: 117/72  Pulse: 105  Temp: 98.6 F (37 C)  TempSrc: Oral  Resp: 16  Height: 5' 8 (1.727 m)  Weight: 189 lb (85.7 kg)  SpO2: 99%  BMI (Calculated): 28.7   Physical Exam Constitutional:      Appearance: Normal appearance.  HENT:     Head: Normocephalic.  Neurological:     General: No focal deficit present.     Mental Status: He is alert and oriented to person, place, and time.  Psychiatric:        Mood and Affect: Mood normal.        Behavior: Behavior normal.        Thought Content: Thought content normal.        Judgment: Judgment normal.       Assessment / Plan:   Assessment 1. Hematuria, unspecified type (Primary) Other orders -     ciprofloxacin (CIPRO) 500 mg tablet; Take one tablet (500 mg dose) by mouth 2 (two) times daily for 7 days., Starting Mon 01/13/2023, Until Mon 01/20/2023, Normal    Plan  He is unable to provide a urine specimen in office today.  He is going to bring us  back a sample.  We are going to treat him empirically for a UTI with Cipro.  Once we get the specimen will run a urinalysis and a culture.  Risks, benefits, and alternatives of the medications and treatment plan prescribed today were discussed, and patient expressed understanding. Plan follow-up as discussed or as needed if any worsening symptoms or change in condition.

## 2023-01-16 NOTE — Progress Notes (Signed)
 Subjective   Patient ID:  Edward English. is a 89 y.o. (DOB 06-25-1933) male.      Patient presents with  . 6 mth f/up    Not fasting. Oatmeal. Pt said that his stomach/ribs are sore from the vomiting that he has been doing the last 4-5 days. Not sure if he had a stomach bug or not.  Wants urine cx results discussed with him.     Hap episode of n/v (clear) and watery diarrhea which has now resolved. Was light headed and dizzy, much better now. Came in and received antibiotic for ? UTI. He only took one antibiotic tablet thinking this was probably norovirus. He continues to have right flank discomfort. No fever. No longer light headed. Feels much better  Dm2. Compliant with medications. No side effects   Social History   Tobacco Use  . Smoking status: Former    Current packs/day: 0.00    Average packs/day: 2.5 packs/day for 50.0 years (125.0 ttl pk-yrs)    Types: Cigarettes    Start date: 91    Quit date: 54    Years since quitting: 33.0    Passive exposure: Past  . Smokeless tobacco: Never  . Tobacco comments:    2-3 PPD x 50 yrs, 93PPY  Vaping Use  . Vaping status: Never Used  Substance Use Topics  . Alcohol use: Yes    Comment: Rare, < 1 mo  . Drug use: No   Family History  Problem Relation Age of Onset  . Diabetes Mother   . Diabetes mellitus Mother   . Heart attack Mother   . Deep vein thrombosis Brother    Past Medical History:  Diagnosis Date  . Bronchitis   . Diabetes mellitus (*)   . Hyperlipidemia   . Peripheral neuropathy    Review of Systems is complete and negative except as noted.  Objective   BP 128/62 (BP Location: Left Upper Arm, Patient Position: Sitting)   Pulse 94   Temp 97.2 F (36.2 C) (Skin)   Resp 18   Ht 5' 8 (1.727 m)   Wt 189 lb 12.8 oz (86.1 kg)   SpO2 96%   BMI 28.86 kg/m  General:  Well developed, Well nourished, No distress HEENT: Normocephalic, atraumatic Neck:  Supple No lymphadenopathy  CV:  S1S2, Regular  rate and rhythm, without murmur, gallops or rubs Lungs:  Clear to auscultation bilaterally with normal effort Abdomen. Soft. Mild right sided discomfort on palpation Skin:  No focal rashes  Extremities:  No clubbing, cyanosis or edema.   Neuro:  No focal deficits; Cranial nerves II-XII intact     Impression   1. Norovirus   2. Bacteriuria   3. Type 2 diabetes mellitus with diabetic neuropathy, with long-term current use of insulin (*)     Plan  Reviewed UA and cx Suspect norovirus with resolution. Check labs UA red but no blood. Cx sensitivity not yet back but probably e coli. Recommend restart cipro. Uncertain why urine was red (myoglobinuria?). He has urology follow up if persists Stable. Continue present medical management check labs   Patient's Medications       * Accurate as of January 16, 2023  1:05 PM. Reflects encounter med changes as of last refresh          Continued Medications      Instructions  acetaminophen  500 mg tablet Commonly known as: TYLENOL   1,000 mg, Oral, Every 6 hours as needed   albuterol  sulfate HFA 108 (90 Base) MCG/ACT inhaler Commonly known as: PROAIR HFA  2 puffs, Inhalation, Every 6 hours as needed, Inhale two puffs every four to six hours as needed.   alfuzosin HCl 10 mg 24 hr tablet Commonly known as: UROXATRAL  10 mg, Oral, Daily   amitriptyline HCl 10 mg tablet Commonly known as: ELAVIL  30 mg, Oral, At bedtime   ciprofloxacin 500 mg tablet Commonly known as: CIPRO  500 mg, Oral, 2 times a day   EPINEPHrine  0.3 mg/0.3 mL injection Commonly known as: AUVI-Q ,EPIPEN   0.3 mg, Intramuscular, Once as needed   fluticasone propionate 50 mcg/actuation nasal spray Commonly known as: FLONASE  Nasal   lovastatin  40 mg tablet Commonly known as: MEVACOR   40 mg, Oral, At bedtime   meclizine HCl 25 mg Chew Commonly known as: BONINE  50 mg, Oral   melatonin 3-10 mg tablet  TAKE 3 TABLETS/CAPSULES (9MG ) BY MOUTH AT BEDTIME TO  ENHANCE SLEEP, BEST TAKEN NIGHTLY   metFORMIN ER 500 mg 24 hr tablet Commonly known as: GLUCOPHAGE-XR  TAKE ONE TABLET BY MOUTH DAILY FOR DIABETES (ANNUAL KIDNEY FUNCTION TESTING IS NEEDED)   prednisoLONE acetate 1% ophthalmic suspension Commonly known as: PRED FORTE,ECONOPRED  1 drop left eye 4x/day for 1 week then 2x/day for 1 week the daily for 1 week then stop   tolterodine 2 mg tablet Commonly known as: DETROL  2 mg, Oral, 3 times a day        Orders Placed This Encounter  Procedures  . CBC And Differential  . Comprehensive Metabolic Panel  . Hemoglobin A1c    Risks, benefits, and alternatives of the medications and treatment plan prescribed today were discussed, and patient expressed understanding. Plan follow-up as discussed or as needed if any worsening symptoms or change in condition.        *Some images could not be shown.

## 2023-01-22 NOTE — Progress Notes (Signed)
 Subjective   Patient ID:  Edward English. is a 89 y.o. (DOB Nov 03, 1933) male.      Patient presents with  . Otalgia    Pt presents today for ear pain x 1week, he seen his dentist on Monday and no teeth problems.   No fever Some sinus pressure on left No drainage Occasional popping in left ear  Completed antibiotic for UTI. Symptoms resolved  DM2. Compliant with medications. No side effects HLD. Compliant with medications. No side effects COPD. Occasional use of MDI  Social History   Tobacco Use  . Smoking status: Former    Current packs/day: 0.00    Average packs/day: 2.5 packs/day for 50.0 years (125.0 ttl pk-yrs)    Types: Cigarettes    Start date: 46    Quit date: 43    Years since quitting: 33.0    Passive exposure: Past  . Smokeless tobacco: Never  . Tobacco comments:    2-3 PPD x 50 yrs, 93PPY  Vaping Use  . Vaping status: Never Used  Substance Use Topics  . Alcohol use: Yes    Comment: Rare, < 1 mo  . Drug use: No   Family History  Problem Relation Age of Onset  . Diabetes Mother   . Diabetes mellitus Mother   . Heart attack Mother   . Deep vein thrombosis Brother    Past Medical History:  Diagnosis Date  . Bronchitis   . Diabetes mellitus (*)   . Hyperlipidemia   . Peripheral neuropathy    Review of Systems is complete and negative except as noted.  Objective   BP 117/70   Pulse 94   Temp 98.7 F (37.1 C) (Oral)   Resp 17   Ht 5' 8 (1.727 m)   Wt 191 lb 1.6 oz (86.7 kg)   SpO2 98%   BMI 29.06 kg/m  General:  Well developed, Well nourished, No distress HEENT: Normocephalic, atraumatic; Pupils equal, round, reactive to light and accommodation; Conjunctiva normal; Hearing aids in place. Bilateral external auditory canals and tympanic membranes normal; Nares normal; Oropharynx moist and clear. Right maxillary sinus tender to palpation Neck:  Supple No lymphadenopathy  CV:  S1S2, Regular rate and rhythm, without murmur, gallops or  rubs Lungs:  Clear to auscultation bilaterally with normal effort Skin:  No focal rashes  Extremities:  No clubbing, cyanosis or edema.   Neuro:  No focal deficits; Cranial nerves II-XII intact     Impression   1. Acute bacterial sinusitis   2. Chronic obstructive pulmonary disease, unspecified COPD type (*)   3. Type 2 diabetes mellitus without complication, without long-term current use of insulin (*)   4. Pure hypercholesterolemia     Plan  Rx as below 2. Stable. Continue present medical management  3. Stable. Continue present medical management reviewed labs 4. Stable. Continue present medical management. Reviewed labs   Patient's Medications       * Accurate as of January 22, 2023  3:10 PM. Reflects encounter med changes as of last refresh          New Prescriptions      Instructions  cefdinir 300 mg capsule Commonly known as: OMNICEF Started by: Robert Beam  300 mg, Oral, 2 times a day       Continued Medications      Instructions  acetaminophen  500 mg tablet Commonly known as: TYLENOL   1,000 mg, Oral, Every 6 hours as needed   albuterol sulfate HFA 108 (90  Base) MCG/ACT inhaler Commonly known as: PROAIR HFA  2 puffs, Inhalation, Every 6 hours as needed, Inhale two puffs every four to six hours as needed.   alfuzosin HCl 10 mg 24 hr tablet Commonly known as: UROXATRAL  10 mg, Oral, Daily   amitriptyline HCl 10 mg tablet Commonly known as: ELAVIL  30 mg, At bedtime   EPINEPHrine  0.3 mg/0.3 mL injection Commonly known as: AUVI-Q ,EPIPEN   0.3 mg, Intramuscular, Once as needed   fluticasone propionate 50 mcg/actuation nasal spray Commonly known as: FLONASE  Nasal   lovastatin  40 mg tablet Commonly known as: MEVACOR   40 mg, Oral, At bedtime   meclizine HCl 25 mg Chew Commonly known as: BONINE  50 mg, Oral   melatonin 3-10 mg tablet  TAKE 3 TABLETS/CAPSULES (9MG ) BY MOUTH AT BEDTIME TO ENHANCE SLEEP, BEST TAKEN NIGHTLY   metFORMIN ER 500  mg 24 hr tablet Commonly known as: GLUCOPHAGE-XR  TAKE ONE TABLET BY MOUTH DAILY FOR DIABETES (ANNUAL KIDNEY FUNCTION TESTING IS NEEDED)   prednisoLONE acetate 1% ophthalmic suspension Commonly known as: PRED FORTE,ECONOPRED  1 drop left eye 4x/day for 1 week then 2x/day for 1 week the daily for 1 week then stop   tolterodine 2 mg tablet Commonly known as: DETROL  2 mg, Oral, 3 times a day        No orders of the defined types were placed in this encounter.   Risks, benefits, and alternatives of the medications and treatment plan prescribed today were discussed, and patient expressed understanding. Plan follow-up as discussed or as needed if any worsening symptoms or change in condition.        *Some images could not be shown.

## 2023-08-05 NOTE — Discharge Summary (Signed)
 [facility]  Discharge Summary  PCP: Lamar MARLA Cornet, MD Discharge Details   Admit date:         07/31/2023 Discharge date and time:       08/05/2023 Hospital LOS:    6 days  Active Hospital Problems   Diagnosis Date Noted POA  . *Obstructive jaundice (*) 07/31/2023 Unknown  . Bile duct, common, cystic dilatation 07/31/2023 Not Applicable  . Intrahepatic bile duct dilation 07/31/2023 Unknown  . Pruritus 07/31/2023 Unknown  . Painless jaundice 07/31/2023 Unknown  . Elevated LFTs 07/31/2023 Unknown  . Weight loss 07/31/2023 Unknown  . Type 2 diabetes mellitus with diabetic neuropathy, unspecified (*) 08/23/2022 Yes  . Peripheral sensory neuropathy due to type 2 diabetes mellitus (*) 12/20/2020 Yes  . Mixed hyperlipidemia 03/29/2016 Yes  . History of BPH 06/21/2014 Yes    Resolved Hospital Problems  No resolved problems to display.      Current Discharge Medication List     START taking these medications      Details  camphor-menthol lotion Commonly known as: SARNA,MEN-PHOR  Apply topically as needed for Itching. Quantity: 222 mL   famotidine 20 mg tablet Commonly known as: PEPCID Start taking on: August 06, 2023  Take one tablet (20 mg dose) by mouth daily. Quantity: 60 tablet   hydrOXYzine HCl 25 mg tablet Commonly known as: ATARAX  Take one tablet (25 mg dose) by mouth 3 (three) times a day as needed for Itching for up to 10 days. Quantity: 30 tablet   ondansetron 4 mg tablet Commonly known as: ZOFRAN  Take one tablet (4 mg dose) by mouth every 4 (four) hours as needed for up to 7 days. Quantity: 20 tablet   polyethylene glycol 17 g packet Commonly known as: MIRALAX Start taking on: August 06, 2023  Take 120 mLs (17 g dose) by mouth daily for 14 days. Quantity: 14 each       CONTINUE these medications which have NOT CHANGED      Details  acetaminophen  500 mg tablet Commonly known as: TYLENOL    Take two tablets (1,000 mg dose) by mouth every 6 (six) hours as needed.   albuterol sulfate HFA 108 (90 Base) MCG/ACT inhaler Commonly known as: PROAIR HFA  Inhale two puffs into the lungs every 6 (six) hours as needed for Wheezing. Inhale two puffs every four to six hours as needed. Quantity: 18 g   alfuzosin HCl 10 mg 24 hr tablet Commonly known as: UROXATRAL  Take one tablet (10 mg dose) by mouth daily.   EPINEPHrine  0.3 mg/0.3 mL injection Commonly known as: AUVI-Q ,EPIPEN   Inject 0.3 mLs (0.3 mg dose) into the muscle 1 (one) time if needed.   fluticasone propionate 50 mcg/actuation nasal spray Commonly known as: FLONASE  by Nasal route.   MELATONIN PO  Take 9 mg by mouth at bedtime as needed for Sleep.   pramipexole dihydrochloride 1 mg tablet Commonly known as: MIRAPEX  Take one tablet (1 mg dose) by mouth at bedtime.   tolterodine 2 mg tablet Commonly known as: DETROL  Take one tablet (2 mg dose) by mouth 2 (two) times daily.      * You might also be taking other medications not listed above. If you have questions about any of your other medications, talk to the person who prescribed them or your Primary Care Provider.          STOP taking these medications  Albuterol-Budesonide 90-80 MCG/ACT Aero   finasteride  5 mg tablet Commonly known as: PROSCAR    lovastatin  40 mg tablet Commonly known as: MEVACOR    meclizine HCl 25 mg Chew Commonly known as: BONINE   metformin 500 mg (OSM) 24 hr tablet Commonly known as: FORTAMET   triamcinolone  0.025% cream Commonly known as: KENALOG        Reason for medication changes:See Below  Hospital Course   Indication for Admission/chief complaint: Jaundice  HPI: Edward English. is a 89 y.o. Caucasian male with a past medical history significant for type 2 diabetes, non-insulin-dependent, hyperlipidemia, diabetic neuropathy, BPH, diverticulosis, and orthostatic hypotension who presents to [facility] for direct  admission from his PCPs office for obstructive jaundice.  He reports pruritus over the last 3 weeks.  He reports a new onset of jaundice within the past few days.  He reports a 10 pound weight loss over the last couple months.  He recently had blood work at the [facility] on July 28, 2023 showing an AST of 76, ALT of 84, alkaline phosphatase of 307, and total bilirubin of 7.4.  He had a CT scan of the abdomen pelvis with IV contrast on July 29, 2023 showing marked dilatation of the common bile duct, cystic duct, and intrahepatic biliary dilatation with abrupt cut off downstream of the common bile duct with probable intraluminal lesion measuring 2.7 cm and concerning for either cholangiocarcinoma versus pancreatic malignancy, hypodensities upstream in the pancreatic body not seen on prior scan in 2019, and faint lucencies in L4-L5 with neoplastic involvement possible.  He also had gallstones and abnormal appearance of his prostate parenchyma.  He saw his PCP today for follow-up of his current symptoms, lab work, and imaging studies.  His PCP contacted Dr. Elisa from hepatobiliary surgery who recommended [facility] admission for further workup of his obstructive jaundice to rule out underlying malignancy.   His vital signs are stable tonight.  Repeat lab work here tonight reveals a glucose of 123, AST of 80, ALT of 75, alkaline phosphatase of 326, total bilirubin of 9.4, normal white blood cell count, hemoglobin of 12, and normal INR.  He denies any fever, chills, chest pain, or shortness of breath.  He does admit to recent dark urine and light-colored stools.  His most bothersome symptom has been pruritus.  He is admitted to the [facility] service for further treatment evaluation of his obstructive jaundice and concern for biliary malignancy.  Hospital Course:       # Obstructive Jaundice.  Pt was evaluated by GI in house.  He underwent ERCP 08/02/23 - Distal common bile duct stricture just proximal to the ampulla. Sphincterotomy,  balloon sweep, brushings and stent placement (10 [place] by 5 cm).  CA 19-9 was noted to be elevated concerning for pancreatic CA or cholangiocarcinoma.  Lft's slightly improved prior to discharge.  Repeat CT scan reviewed by Dr. Jefrey with GI and he reports stent is in good position and he will plan for pt to go home and f/u closely with GI.  He will likely need repeat ERCP.  Would monitor pt closely with LFT's etc for cholangitis, etc.  Pathology of ERCP brushings still pending at discharge.  Pt completed short course of zosyn in house.  CEA wnl in house.  Would also consider EUS as an outpt.    # Post ERCP Pancreatitis, RESOLVING.  Noted AM of 08/03/23.  Improved with IVF   # Increased WOB and Concern for Iatrogenic Volume Overload after IVF for Pancreatitis.  Started aggressive IVF for post ERCP pancreatitis 08/03/23 as noted above.  This resolved with 1 dose of IV lasix    # Brief Bradycardic/Asystolic? Episode During Beginning of ERCP 08/02/23.  I discussed w/ Dr. Editha 08/02/23.  He states it was a very brief period that quickly responded to atropine and suspect more of a vagal response.  Unfortunately, there are no telemetry strips of this occurrence.  I agree that this is likely a vagal response.  There is no utility in a cardiology consultation at this time without rhythm strips.  He was monitored on telemetry in house without recurrence.   - cont to monitor on telemetry   # Minor Hypercalcemia.  Suspect this could be related to malignancy.  Would consider outpt w/u including PTH, etc.     # Pruritus.  Likely related to #1.  Improved with prn atarax and prn sarna   # DM.  Will stop metformin at discharge with A1c well below age adjusted goal.     # HLD.  Will stop statin with elevated LFT's at discharge.  He will f/u with PCP   # BPH, Urge Incontinence.  Pt was continued on home flomax + oxybutynin    # Peripheral Neuropathy.  Pt was continued on home mirapex?  - more likely RLS?   # COPD.   No exacerbation in house.  Pt was continued on home prn bronchodilators.    Mechanical Fall in the Hospital on Outstretched Hand and Onto Buttocks on Day of Discharge.  XR pelvis and hips negative.  XR hand and wrist negative.  Pt ok to discharge and he wants to go home.  He is not interested in rehab.     Recommendations to physicians/followup needed: F/u PCP 1 week, f/u GI, monitor LFTs carefully, f/u brushing pathology, consider EUS    Physical Exam: Vitals:   08/05/23 0051 08/05/23 0431 08/05/23 0720 08/05/23 1212  BP: 146/70 (!) 114/59 145/78 142/74  BP Location: Right Upper Arm Right Upper Arm Right Upper Arm Right Upper Arm  Patient Position: Lying Lying Lying Lying  Pulse: 70 78 78 89  Resp: 18 18 20 18   Temp: 97.2 F (36.2 C) 97.9 F (36.6 C) 97.8 F (36.6 C)   TempSrc: Oral Oral Oral Oral  SpO2: 94% 93% 99% 97%  Weight:  85.2 kg (187 lb 13.3 oz)    Height:       Constitutional -NAD, walking around the room upon my arrival.   Eyes - +icteric sclera Nose - no gross deformity or drainage Mouth -MMM, SL jaundice noted Throat - no swelling or erythema     CV - (+)S1S2, regular, and trace to 1+ lower extremity edema. Resp - CTA bilaterally.  No wheezing or rhonchi. GI - (+)BS, soft, mild epigastric tenderness, no rebound, and no guarding. MSK- ROM Normal. Skin -jaundice.  Bruising and areas of excoriation from itching noted on his upper extremities noted again today Neuro - alert, aware, oriented to person/place/time.  Follows all commands.  No confusion. Psych -Reserved affect.  Pleasant and cooperative.    Labs on Discharge:  Recent Labs    Units 08/05/23 0525 08/04/23 0133 08/03/23 0259 08/02/23 0555 08/01/23 0818 07/31/23 2051  WBC thou/mcL 15.9* 16.6* 8.9 7.3 8.3 8.5  HGB gm/dL 88.5* 88.5* 88.8* 88.8* 12.7* 12.0*  HCT % 33.8* 34.1* 33.6* 34.5* 40.0* 37.1*  PLT thou/mcL 200 206 192 161 211 210   Recent Labs    Units 08/05/23 0525 08/04/23 0133  08/03/23  9740 08/02/23 0555 08/01/23 0818 07/31/23 2051  NA mmol/L 134* 136 139 136 140 141  K mmol/L 3.7 3.6* 3.8 3.6* 3.5* 4.2  CL mmol/L 103 107 110* 108 106 108  CO2 mmol/L 22 20 20  19* 20 20  BUN mg/dL 12 11 13 13 20 22   CREATININE mg/dL 9.29* 9.29* 8.99 9.29* 0.90 0.90  CALCIUM mg/dL 8.5* 8.5* 8.0* 8.3* 9.1 8.9  PHOS mg/dL 2.2* 1.9*  --  3.1  --   --    Recent Labs    Units 08/05/23 0525 08/04/23 0133 08/03/23 0259 08/02/23 0555 08/01/23 0818  BILITOT mg/dL 8.9* 8.3* 7.2* 8.6* 89.0*  AST U/L 94* 99* 94* 93* 88*  ALT U/L 73* 69* 68* 70* 80*  ALKPHOS U/L 265* 261* 242* 250* 337*  ALBUMIN gm/dL 2.7* 2.9* 2.7* 2.7* 3.5   Recent Labs    Units 07/31/23 1645  HGBA1C % 5.6   Recent Labs    Units 08/05/23 0525 08/04/23 0133 08/02/23 0555 07/31/23 2051  INR  1.0 1.1 1.1 1.0   No results for input(s): CHOL, LDL, HDL, TRIG in the last 168 hours. No results for input(s): TROPONIN, CK in the last 168 hours.  Invalid input(s): CK-MB  Diagnostics:  CT Abdomen Pelvis WO IV Contrast  Final Result  IMPRESSION:    1.  Interval placement of a biliary stent extending from the mid CBD through the duodenum. However, there is persistent severe intrahepatic and proximal extrahepatic biliary ductal dilatation. Recommend GI evaluation to assess need for endoscopic manipulation.  2.  Increased conspicuity of generalized mild peripancreatic fat stranding and trace inflammatory fluid, likely a secondary pancreatitis. Recommend correlation with serum lipase values.  3.  Slightly increased wall thickening of the proximal and mid portions of the duodenum, potentially reactive inflammation versus postprocedural change.  4.  Findings suggesting generalized volume overload with small volume ascites, small right pleural effusion, and increased body wall edema.        Electronically Signed by: Massie Bracket on 08/05/2023 12:35 PM    XR Pelvis And Bilat Hips 3-4 Views  Final  Result  IMPRESSION:    Degenerative change both hips    Electronically Signed by: Sonny Appl, MD on 08/05/2023 9:40 AM    Xr Hand 3+ Views Right  Final Result  IMPRESSION:    No significant findings.    Electronically Signed by: Sonny Appl, MD on 08/05/2023 9:42 AM    XR Wrist 2 Views Right  Final Result  IMPRESSION:    No significant findings.    Electronically Signed by: Sonny Appl, MD on 08/05/2023 9:41 AM    XR Chest Ap Portable  Final Result  IMPRESSION:  No acute pulmonary abnormality.      Electronically Signed by: Allison Kobus on 08/04/2023 8:41 AM    ERCP  Final Result  Physician: Lonni Bowers, MD    Procedure: ERCP with sphincterotomy, balloon sweep, brushing and stent   placement    Indication:  Jaundice    Postprocedure Diagnosis:   Distal common bile duct stricture just proximal to the ampulla.    Sphincterotomy, balloon sweep, brushings and stent placement (10 [place] by   5 cm)  Dilated common bile duct to approximate 2.5-3cm  Cystic duct and gallbladder did not opacify      Impression:   Major papilla sphincterotomy was performed.  Multiple sweeps were performed in the common bile duct. No material was   present to remove.  10 mm stricture was visualized in  the distal common bile duct  Performed brushings in the distal common bile duct  One 10 Fr x 5 cm stent was placed in the common bile duct      Recommendations:  Monitor for post ERCP pancreatitis  Trend liver test  Follow-up brushing results  If the brushings are nondiagnostic patient would likely benefit from an   EUS  Patient had a brief asystolic event during the procedure which may have   been a vagal event due to the endoscope and insufflation.  Cardiology   consultation was recommended by anesthesiology.  This was discussed with   Dr. Payton.  Diet as tolerated  Repeat ERCP in 4 to 6 weeks for stent management.    Findings:  Limited views of the esophagus, stomach and  duodenum appeared normal.  The common bile duct was cannulated using a traction sphincterotome with   guidewire. Cannulation was not difficult. Contrast was injected and a   cholangiogram was performed and interpreted.  The common bile duct was   dilated to approximately 2.5 cm.  The intrahepatic ducts were also   dilated.  The cystic duct and gallbladder did not opacify.  There was an   abrupt cut off in the very distal, all duct just proximal to the ampulla.    No obvious filling defects were seen.  Small major papilla sphincterotomy was performed using a sphincterotome.   No bleeding was noted at the procedure site.  Multiple sweeps were performed in the common bile duct using a 15 mm   balloon. No material was present to remove. The balloon showed an abrupt   narrowing of the common bile duct just proximal to the ampulla  10 mm stricture was visualized in the distal common bile duct. The   stricture was traversable  Performed brushings in the distal common bile duct  One 10 Fr x 5 cm plastic stent was placed successfully in the common bile   duct             Procedure Information     Procedure Details:   The patient underwent monitored anesthesia care, which was administered by   an anesthesia professional. The patient's blood pressure, ECG, ETCO2,   heart rate, respirations, oxygen saturation and level of consciousness   were monitored throughout the procedure. The scope was introduced through   the mouth and advanced to the second part of the duodenum. Insufflated   with carbon dioxide. The patient experienced no blood loss. The procedure   was not difficult. The patient tolerated the procedure well. There were no   apparent adverse events. Indomethacin 100mg  was given at the start of the   procedure.       Medication Totals:  indomethacin (INDOCIN) suppository - 100 mg  (Totals for administrations occurring from 1225 to 1315 on 08/02/23)      Specimens:   ID Type  Source Tests Collected by Time   1 : brushing Non-GYN Bile Duct Brushing NON GYN CYTOLOGY REQUEST   Lonni Bowers, MD 08/02/2023  1:07 PM       Instrument/Scope:   * No instrument listed *       Implants         Implant Name    Manufacturer Lot No. LRB No. Used Action Chg Desc    STENT ADVANIX BIL DB [id number]) - [id number] [[id number]] STENT   ADVANIX BIL DB [id number] - [id number] [[id number]] [employer]   SCIENTIFIC - MICROVASIV [[id number]] [id number] N/A [4] 1 Implanted [  1] HC SUP   -STENT  NON COR  TEM W DEL SY [[id number]]            .                    FL ERCP  Final Result  IMPRESSION:Fluoroscopy 1.9 minutes.  Reference Air Kerma 14.3 mGy.  5 views show placement of common bile duct stent     Electronically Signed by: Glendia Guillaume, MD on 08/03/2023 8:12 AM    CT Abdomen Pelvis WO W IV Contrast  Final Result  IMPRESSION:  1. Biliary ductal dilatation down to the pancreas suggests subtle mass in the pancreas. Recommend endoscopic ultrasound for further evaluation.  2. Gallstones    Electronically Signed by: Glendia Guillaume, MD on 08/02/2023 8:04 AM    CT Chest W Contrast  Final Result  IMPRESSION:  1.  Stable small bilateral pulmonary nodules, largest measuring 3 mm in the subpleural left upper lobe.  2.  Moderate changes of centrilobular emphysema. Reticular and groundglass opacities in the lung bases likely represent early interstitial lung disease.  3.  Significant dilation of the common and intrahepatic biliary ducts. Prominent pancreatic duct measures 3 mm. Findings concerning for obstructing pancreatic mass.          Electronically Signed by: Morene Gulling on 08/01/2023 10:39 AM        Post Hospital Care   Discharge Procedure Orders  Follow-up with Primary Care Physician  Referral Priority: Routine Referral Type: Consultation  Referral Reason: Evaluate and Return  Referred to Provider: DORCUS CHARLESTON K  Number of Visits Requested: 1  Expiration Date: 01/31/24   Ambulatory referral to Gastroenterology  Referral Priority: Routine Referral Type: Consultation  Referral Reason: Evaluate and Return  Requested Specialty: Gastroenterology  Number of Visits Requested: 1 Expiration Date: 01/31/24   Notify physician - Temp  Order Comments: Call MD if Temperature above 100.3  Degrees F   Notify Physician for trouble breathing or symptoms that are worse   Notify Physician and Call 911 if you experience any of the following: Sudden numbness or weakness, sudden trouble speaking, vision problems, trouble walking, sudden severe headache with no known cause.   Notify Physician and CALL 911 if you experience Chest Pain or discomfort, especially if associated with tiredness, sick on stomach or sweaty feeling.   Notify Physician for dizziness when trying to stand   Notify Physician for increase shortness of breath with activity or at rest   Notify Physician for chest pain / heart pain / chest heaviness   Notify Physician for palpitations (Fluttering in your chest)   Notify Physician for confusion or disorientation   Notify Physician for increased or unrelieved pain   Notify Physician for increased shortness of breath   Notify physician - Call your doctor if you experience nausea/vomiting   Activity as tolerated   Discharge instructions  Order Comments: Please see your primary care doctor in 1 week.  Please see gastroenterology in 1-2 weeks.    Diet: Cardiac diet Cardiac  Potential for Rehab:        Good Code Status:   Full Code Disposition: Home Consults: GI  Followup appointments: No future appointments.   Time spent in discharge process:  total time spent 45 minutes   Electronically signed: Arthea CHRISTELLA Hoe, MD 08/05/2023 / 1:03 PM   *Some images could not be shown.

## 2023-10-15 DIAGNOSIS — N3941 Urge incontinence: Secondary | ICD-10-CM | POA: Diagnosis not present

## 2023-10-15 DIAGNOSIS — G2581 Restless legs syndrome: Secondary | ICD-10-CM | POA: Diagnosis not present

## 2023-10-15 DIAGNOSIS — K219 Gastro-esophageal reflux disease without esophagitis: Secondary | ICD-10-CM | POA: Diagnosis not present

## 2023-12-01 DIAGNOSIS — K219 Gastro-esophageal reflux disease without esophagitis: Secondary | ICD-10-CM | POA: Diagnosis not present

## 2023-12-01 DIAGNOSIS — K5900 Constipation, unspecified: Secondary | ICD-10-CM | POA: Diagnosis not present

## 2023-12-10 DIAGNOSIS — G2581 Restless legs syndrome: Secondary | ICD-10-CM | POA: Diagnosis not present

## 2023-12-10 DIAGNOSIS — R32 Unspecified urinary incontinence: Secondary | ICD-10-CM | POA: Diagnosis not present

## 2023-12-10 DIAGNOSIS — K219 Gastro-esophageal reflux disease without esophagitis: Secondary | ICD-10-CM | POA: Diagnosis not present
